# Patient Record
Sex: Female | Born: 1960 | Race: Black or African American | Hispanic: No | Marital: Single | Smoking: Never smoker
Health system: Southern US, Community
[De-identification: ages and names within clinical notes are randomized; demographics above are authoritative.]

## PROBLEM LIST (undated history)

## (undated) DIAGNOSIS — I10 Essential (primary) hypertension: Secondary | ICD-10-CM

## (undated) DIAGNOSIS — E7800 Pure hypercholesterolemia, unspecified: Secondary | ICD-10-CM

## (undated) HISTORY — PX: TUBAL LIGATION: SHX77

## (undated) HISTORY — PX: OTHER SURGICAL HISTORY: SHX169

## (undated) HISTORY — DX: Essential (primary) hypertension: I10

---

## 2005-04-15 ENCOUNTER — Ambulatory Visit: Payer: Self-pay | Admitting: Family Medicine

## 2005-12-04 ENCOUNTER — Ambulatory Visit: Payer: Self-pay | Admitting: Family Medicine

## 2007-01-06 ENCOUNTER — Ambulatory Visit: Payer: Self-pay | Admitting: Family Medicine

## 2007-07-21 ENCOUNTER — Ambulatory Visit: Payer: Self-pay | Admitting: Family Medicine

## 2016-05-27 LAB — HM HIV SCREENING LAB: HM HIV SCREENING: NEGATIVE

## 2016-05-27 LAB — HM PAP SMEAR: HM Pap smear: NEGATIVE

## 2016-05-27 LAB — HIV ANTIBODY (ROUTINE TESTING W REFLEX): HIV: NEGATIVE

## 2016-05-27 LAB — HM HEPATITIS C SCREENING LAB: HM Hepatitis Screen: NEGATIVE

## 2016-06-13 LAB — FECAL OCCULT BLOOD, GUAIAC: Fecal Occult Blood: NEGATIVE

## 2017-04-17 ENCOUNTER — Ambulatory Visit (HOSPITAL_COMMUNITY)
Admission: EM | Admit: 2017-04-17 | Discharge: 2017-04-17 | Disposition: A | Discharge status: Home / Self Care | Payer: Self-pay | Attending: Family Medicine | Admitting: Family Medicine

## 2017-04-17 ENCOUNTER — Encounter (HOSPITAL_COMMUNITY): Payer: Self-pay

## 2017-04-17 DIAGNOSIS — I10 Essential (primary) hypertension: Secondary | ICD-10-CM

## 2017-04-17 MED ORDER — AMLODIPINE BESYLATE 5 MG PO TABS: 5.0000 mg | ORAL_TABLET | Freq: Every day | ORAL | 0 refills | Status: DC

## 2017-04-17 NOTE — ED Provider Notes (Signed)
  [facility]   [id number] 04/17/17 Arrival Time: 1206  ASSESSMENT & PLAN:  1. Essential hypertension     Meds ordered this encounter  Medications  . amLODipine (NORVASC) 5 MG tablet    Sig: Take 1 tablet (5 mg total) by mouth daily.    Dispense:  30 tablet    Refill:  0    Order Specific Question:   Supervising Provider    Answer:   Eustace MooreMURRAY, LAURA W [[id number]]   [facility] and make an appointment in next few weeks.  Reviewed expectations re: course of current medical issues. Questions answered. Outlined signs and symptoms indicating need for more acute intervention. Patient verbalized understanding. After Visit Summary given.   SUBJECTIVE:  Jodi Martin is a 56 y.o. female who presents with complaint of   ROS: As per HPI.   OBJECTIVE:  Vitals:   04/17/17 1228  BP: (!) 206/122  Pulse: 68  Resp: 18  Temp: 98.7 F (37.1 C)  TempSrc: Oral  SpO2: 100%     General appearance: alert; no distress HEENT: normocephalic; atraumatic; conjunctivae normal; TMs normal; nasal mucosa normal; oral mucosa normal Neck: supple Lungs: clear to auscultation bilaterally Heart: regular rate and rhythm Abdomen: soft, non-tender; bowel sounds normal; no masses or organomegaly; no guarding or rebound tenderness Back: no CVA tenderness Extremities: no cyanosis or edema; symmetrical with no gross deformities Skin: warm and dry Neurologic: normal symmetric reflexes; normal gait Psychological:  alert and cooperative; normal mood and affect  No results found for this or any previous visit.  Labs Reviewed - No data to display  No results found.  No Known Allergies  PMHx, SurgHx, SocialHx, Medications, and Allergies were reviewed in the Visit Navigator and updated as appropriate.      Deatra CanterOxford, William J, FNP 04/17/17 1243

## 2017-04-17 NOTE — ED Triage Notes (Signed)
Pt was at the dentist today and they sent her over for high BP. Said she had a physical in June and it was normal but doesn't have a hx of high BP and isn't on medication for it. No complaints of lightheaded and dizziness but is having hot spells.

## 2017-05-01 ENCOUNTER — Encounter: Payer: Self-pay | Admitting: Physician Assistant

## 2017-05-01 ENCOUNTER — Ambulatory Visit (INDEPENDENT_AMBULATORY_CARE_PROVIDER_SITE_OTHER): Payer: Self-pay | Admitting: Physician Assistant

## 2017-05-01 VITALS — BP 170/100 | HR 73 | Temp 98.2°F | Resp 16 | Ht 63.0 in | Wt 114.6 lb

## 2017-05-01 DIAGNOSIS — I10 Essential (primary) hypertension: Secondary | ICD-10-CM

## 2017-05-01 DIAGNOSIS — E7800 Pure hypercholesterolemia, unspecified: Secondary | ICD-10-CM | POA: Insufficient documentation

## 2017-05-01 DIAGNOSIS — Z7689 Persons encountering health services in other specified circumstances: Secondary | ICD-10-CM

## 2017-05-01 MED ORDER — AMLODIPINE BESYLATE 10 MG PO TABS: 10.0000 mg | ORAL_TABLET | Freq: Every day | ORAL | 2 refills | Status: DC

## 2017-05-01 NOTE — Patient Instructions (Signed)

## 2017-05-01 NOTE — Progress Notes (Signed)
Patient: Jodi Martin Female    DOB: 1961-06-09   56 y.o.   MRN: [id number] Visit Date: 05/01/2017  Today's Provider: Trey Sailors, PA-C   Chief Complaint  Patient presents with  . New Patient (Initial Visit)   Subjective:    HPI Jodi Martin is a 56 year old female who presents today to Establish Care as a new patient. She was previously being seen at [facility]. She does not have any concerns today. Only medical history is HTN. She is currently taking Amlodipine 5 mg. Medication was prescribed at the ER on 04/17/17. ER referred patient. She will need refills today. Denies problems sleeping, says she does snore.  She was previously seen at [facility]. Had normal PAP 05/2016 and negative hemoccult card X 3. Never had colonoscopy. She has not had mammogram recently. Last mammogram in chart 2008 normal. Had negative HIV and Hep C screening 05/2016. CMET, CBC w/ diff were normal. Her lipid panel from 05/2016 showed elevated cholesterol.   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 She has is not in relationship. Has one child. Not sexually active. No smoking, alcohol, or drugs. Last menstrual cycle two years ago.    Previous Medications   No medications on file    Review of Systems  Constitutional: Negative.   HENT: Negative.   Eyes: Negative.   Respiratory: Negative.   Cardiovascular: Negative.   Gastrointestinal: Positive for abdominal distention.  Endocrine: Positive for heat intolerance.  Genitourinary: Negative.   Musculoskeletal: Negative.   Skin: Negative.   Allergic/Immunologic: Negative.   Neurological: Negative.   Hematological: Negative.   Psychiatric/Behavioral: Negative.     Social History  Substance Use Topics  . Smoking status: Never Smoker  . Smokeless tobacco: Never Used  . Alcohol use No   Objective:   BP (!) 170/100 (BP Location: Left Arm, Cuff Size: Normal)   Pulse 73   Temp 98.2 F (36.8 C) (Oral)    Resp 16   Ht 5\' 3"  (1.6 m)   Wt 114 lb 9.6 oz (52 kg)   SpO2 99%   BMI 20.30 kg/m   Physical Exam  Constitutional: She is oriented to person, place, and time. She appears well-developed and well-nourished.  HENT:  Right Ear: External ear normal.  Left Ear: External ear normal.  Mouth/Throat: Oropharynx is clear and moist. No oropharyngeal exudate.  Neck: Neck supple. Carotid bruit is not present. No thyromegaly present.  Cardiovascular: Normal rate, regular rhythm and normal heart sounds.   Pulmonary/Chest: Effort normal and breath sounds normal.  Lymphadenopathy:    She has no cervical adenopathy.  Neurological: She is alert and oriented to person, place, and time.  Skin: Skin is warm and dry.  Psychiatric: She has a normal mood and affect. Her behavior is normal.        Assessment & Plan:     1. Encounter to establish care  Self pay, declines labs today.  2. Hypertension, unspecified type  Increase to 10 mg. Will see what effect this has, possibly explore sleep apnea. 10 year CVD risk 15.8% right now, but may change when BP better controlled. Will discuss with patient at follow up repeating lipids. Counseled on signs/symptoms indicating need for emergent care.  - amLODipine (NORVASC) 10 MG tablet; Take 1 tablet (10 mg total) by mouth daily.  Dispense: 30 tablet; Refill: 2    Follow up: Return in about 1 month (around 06/01/2017) for BP, med check .

## 2017-06-05 ENCOUNTER — Encounter: Payer: Self-pay | Admitting: Physician Assistant

## 2017-06-05 ENCOUNTER — Ambulatory Visit (INDEPENDENT_AMBULATORY_CARE_PROVIDER_SITE_OTHER): Payer: Self-pay | Admitting: Physician Assistant

## 2017-06-05 ENCOUNTER — Telehealth: Payer: Self-pay | Admitting: Physician Assistant

## 2017-06-05 DIAGNOSIS — I10 Essential (primary) hypertension: Secondary | ICD-10-CM | POA: Insufficient documentation

## 2017-06-05 MED ORDER — LISINOPRIL 10 MG PO TABS: 10.0000 mg | ORAL_TABLET | Freq: Every day | ORAL | 3 refills | Status: DC

## 2017-06-05 NOTE — Progress Notes (Signed)
Patient: Jodi Martin Female    DOB: [birth year]   56 y.o.   MRN: [id number] Visit Date: 06/05/2017  Today's Provider: Trey Sailors, PA-C   Chief Complaint  Patient presents with  . Hypertension    1 month follow up   Subjective:    Pt comes in today for a one month follow up on blood pressure.  At her last office visit her Amlodipine was increased to  a day.  She reports she is tolerating the increase well.  She has not been checking her blood pressure but reports feeling well.      Hypertension  This is a chronic problem. The problem has been gradually improving since onset. Associated symptoms include peripheral edema. Pertinent negatives include no anxiety, blurred vision, chest pain, headaches, malaise/fatigue, neck pain, palpitations, PND or shortness of breath. There are no associated agents to hypertension. Past treatments include calcium channel blockers. There are no compliance problems.        No Known Allergies   Current Outpatient Prescriptions:  .  amLODipine (NORVASC) 10 MG tablet, Take 1 tablet (10 mg total) by mouth daily., Disp: 30 tablet, Rfl: 2  Review of Systems  Constitutional: Negative.  Negative for malaise/fatigue.  Eyes: Negative for blurred vision.  Respiratory: Negative.  Negative for shortness of breath.   Cardiovascular: Positive for leg swelling (Only mild swelling). Negative for chest pain, palpitations and PND.  Gastrointestinal: Negative.   Musculoskeletal: Negative.  Negative for neck pain.  Neurological: Negative for dizziness, light-headedness and headaches.    Social History  Substance Use Topics  . Smoking status: Never Smoker  . Smokeless tobacco: Never Used  . Alcohol use No   Objective:   BP (!) 156/86 (BP Location: Left Arm, Patient Position: Sitting, Cuff Size: Normal)   Pulse 64   Temp 98 F (36.7 C) (Oral)   Resp 16   Wt 113 lb (51.3 kg)   BMI 20.02 kg/m  Vitals:   06/05/17 0904  BP: (!)  156/86  Pulse: 64  Resp: 16  Temp: 98 F (36.7 C)  TempSrc: Oral  Weight: 113 lb (51.3 kg)     Physical Exam  Constitutional: She is oriented to person, place, and time. She appears well-developed and well-nourished.  Cardiovascular: Normal rate and regular rhythm.   Pulmonary/Chest: Effort normal and breath sounds normal.  Neurological: She is alert and oriented to person, place, and time.  Skin: Skin is warm and dry.  Psychiatric: She has a normal mood and affect. Her behavior is normal.        Assessment & Plan:     1. Hypertension, unspecified type  Decreased from last visit, but still not controlled. I have reviewed her CMET from 05/2016 with [facility] and her renal function was normal and stable. Will start 10 mg of Lisinopril in addition to amlodipine. Counseled on cough, angioedema. Will see in three months for BP check and follow up CMET.  - lisinopril (PRINIVIL,ZESTRIL) 10 MG tablet; Take 1 tablet (10 mg total) by mouth daily.  Dispense: 90 tablet; Refill: 3  Return in about 3 months (around 09/04/2017) for HTN, labs.  The entirety of the information documented in the History of Present Illness, Review of Systems and Physical Exam were personally obtained by me. Portions of this information were initially documented by Kavin Leech, CMA and reviewed by me for thoroughness and accuracy.         Trey Sailors, PA-C  [facility]

## 2017-06-05 NOTE — Telephone Encounter (Signed)
Pt was in today and seen Adriana and she is wanting to know if she is supposed to be taking the Amlodipine 10 mg.  She did not understand if she was or not.  Her call back is [phone]  Thanks teri

## 2017-06-05 NOTE — Telephone Encounter (Signed)
Yes, in addition. Thanks.

## 2017-06-05 NOTE — Patient Instructions (Signed)
Coricidin - decongestant for high blood pressure Flonase - is good for congestion NO pseudoephedrine or phenylephrine - commonly known as Sudafed   Hypertension Hypertension is another name for high blood pressure. High blood pressure forces your heart to work harder to pump blood. This can cause problems over time. There are two numbers in a blood pressure reading. There is a top number (systolic) over a bottom number (diastolic). It is best to have a blood pressure below 120/80. Healthy choices can help lower your blood pressure. You may need medicine to help lower your blood pressure if:  Your blood pressure cannot be lowered with healthy choices.  Your blood pressure is higher than 130/80.  Follow these instructions at home: Eating and drinking  If directed, follow the DASH eating plan. This diet includes: ? Filling half of your plate at each meal with fruits and vegetables. ? Filling one quarter of your plate at each meal with whole grains. Whole grains include whole wheat pasta, brown rice, and whole grain bread. ? Eating or drinking low-fat dairy products, such as skim milk or low-fat yogurt. ? Filling one quarter of your plate at each meal with low-fat (lean) proteins. Low-fat proteins include fish, skinless chicken, eggs, beans, and tofu. ? Avoiding fatty meat, cured and processed meat, or chicken with skin. ? Avoiding premade or processed food.  Eat less than 1,500 mg of salt (sodium) a day.  Limit alcohol use to no more than 1 drink a day for nonpregnant women and 2 drinks a day for men. One drink equals 12 oz of beer, 5 oz of wine, or 1 oz of hard liquor. Lifestyle  Work with your doctor to stay at a healthy weight or to lose weight. Ask your doctor what the best weight is for you.  Get at least 30 minutes of exercise that causes your heart to beat faster (aerobic exercise) most days of the week. This may include walking, swimming, or biking.  Get at least 30 minutes of  exercise that strengthens your muscles (resistance exercise) at least 3 days a week. This may include lifting weights or pilates.  Do not use any products that contain nicotine or tobacco. This includes cigarettes and e-cigarettes. If you need help quitting, ask your doctor.  Check your blood pressure at home as told by your doctor.  Keep all follow-up visits as told by your doctor. This is important. Medicines  Take over-the-counter and prescription medicines only as told by your doctor. Follow directions carefully.  Do not skip doses of blood pressure medicine. The medicine does not work as well if you skip doses. Skipping doses also puts you at risk for problems.  Ask your doctor about side effects or reactions to medicines that you should watch for. Contact a doctor if:  You think you are having a reaction to the medicine you are taking.  You have headaches that keep coming back (recurring).  You feel dizzy.  You have swelling in your ankles.  You have trouble with your vision. Get help right away if:  You get a very bad headache.  You start to feel confused.  You feel weak or numb.  You feel faint.  You get very bad pain in your: ? Chest. ? Belly (abdomen).  You throw up (vomit) more than once.  You have trouble breathing. Summary  Hypertension is another name for high blood pressure.  Making healthy choices can help lower blood pressure. If your blood pressure cannot be controlled  with healthy choices, you may need to take medicine. This information is not intended to replace advice given to you by your health care provider. Make sure you discuss any questions you have with your health care provider. Document Released: 02/11/2008 Document Revised: 07/23/2016 Document Reviewed: 07/23/2016 Elsevier Interactive Patient Education  [employer].

## 2017-06-09 NOTE — Telephone Encounter (Signed)
Pt advised Friday.   Thanks,   -Vernona Rieger

## 2017-08-01 ENCOUNTER — Other Ambulatory Visit: Payer: Self-pay | Admitting: Physician Assistant

## 2017-08-01 DIAGNOSIS — I10 Essential (primary) hypertension: Secondary | ICD-10-CM

## 2017-09-04 ENCOUNTER — Ambulatory Visit: Payer: Self-pay | Admitting: Physician Assistant

## 2017-09-04 VITALS — BP 148/80 | HR 82 | Temp 97.7°F | Resp 16 | Wt 113.0 lb

## 2017-09-04 DIAGNOSIS — E785 Hyperlipidemia, unspecified: Secondary | ICD-10-CM

## 2017-09-04 DIAGNOSIS — I10 Essential (primary) hypertension: Secondary | ICD-10-CM

## 2017-09-04 MED ORDER — AMLODIPINE BESYLATE 10 MG PO TABS: 10.0000 mg | ORAL_TABLET | Freq: Every day | ORAL | 1 refills | Status: DC

## 2017-09-04 NOTE — Patient Instructions (Signed)

## 2017-09-04 NOTE — Progress Notes (Signed)
Patient: Jodi Martin Female    DOB: May 28, 1961   56 y.o.   MRN: [id number] Visit Date: 09/04/2017  Today's Provider: Trey SailorsAdriana M Pollak, PA-C   Chief Complaint  Patient presents with  . Hypertension   Subjective:    HPI   She reports she is doing well with the addition of Lisinopril. No cough, angioedema. She reports she is out of Lisinopril and did not take that medication today.    Hypertension, follow-up:  BP Readings from Last 3 Encounters:  09/04/17 (!) 148/80  06/05/17 (!) 156/86  05/01/17 (!) 170/100    She was last seen for hypertension 3 months ago.  BP at that visit was 156/86. Management since that visit includes increased adding 10 mg Lisinopril QD to her 10 mg amlodipine QD. She reports good compliance with treatment. She is not having side effects.  She is exercising. She is adherent to low salt diet.   Outside blood pressures are not being checked recently. She is experiencing none.  Patient denies chest pain, chest pressure/discomfort, claudication, dyspnea, exertional chest pressure/discomfort, fatigue, irregular heart beat, lower extremity edema, near-syncope, orthopnea, palpitations, paroxysmal nocturnal dyspnea, syncope and tachypnea.   Cardiovascular risk factors include hypertension.  Use of agents associated with hypertension: none.     Weight trend: stable Wt Readings from Last 3 Encounters:  09/04/17 113 lb (51.3 kg)  06/05/17 113 lb (51.3 kg)  05/01/17 114 lb 9.6 oz (52 kg)      ------------------------------------------------------------------------ .     No Known Allergies   Current Outpatient Medications:  .  amLODipine (NORVASC) 10 MG tablet, TAKE 1 TABLET BY MOUTH ONCE DAILY, Disp: 30 tablet, Rfl: 2 .  lisinopril (PRINIVIL,ZESTRIL) 10 MG tablet, Take 1 tablet (10 mg total) by mouth daily., Disp: 90 tablet, Rfl: 3  Review of Systems  Constitutional: Negative.   HENT: Negative.   Eyes: Negative.     Respiratory: Negative.   Cardiovascular: Negative.   Gastrointestinal: Negative.   Endocrine: Negative.   Genitourinary: Negative.   Musculoskeletal: Negative.   Skin: Negative.   Allergic/Immunologic: Negative.   Neurological: Negative.   Hematological: Negative.   Psychiatric/Behavioral: Negative.     Social History   Tobacco Use  . Smoking status: Never Smoker  . Smokeless tobacco: Never Used  Substance Use Topics  . Alcohol use: No   Objective:   BP (!) 148/80 (BP Location: Left Arm, Patient Position: Sitting, Cuff Size: Normal)   Pulse 82   Temp 97.7 F (36.5 C) (Oral)   Resp 16   Wt 113 lb (51.3 kg)   BMI 20.02 kg/m  Vitals:   09/04/17 0859  BP: (!) 148/80  Pulse: 82  Resp: 16  Temp: 97.7 F (36.5 C)  TempSrc: Oral  Weight: 113 lb (51.3 kg)     Physical Exam  Constitutional: She is oriented to person, place, and time. She appears well-developed and well-nourished.  Cardiovascular: Normal rate.  Pulmonary/Chest: Effort normal and breath sounds normal.  Neurological: She is alert and oriented to person, place, and time.  Skin: Skin is warm and dry.  Psychiatric: She has a normal mood and affect. Her behavior is normal.        Assessment & Plan:     1. Hypertension, unspecified type  Need follow up labs today after addition of Lisinopril. Her BP is slightly elevated but she did not take her Lisinopril today and so we have mutually agreed to recheck at follow up  visit before increasing medication. Will see her in 3 months for physical.  - amLODipine (NORVASC) 10 MG tablet; Take 1 tablet (10 mg total) by mouth daily.  Dispense: 90 tablet; Refill: 1 - Comprehensive Metabolic Panel (CMET) - Lipid Profile  2. Hyperlipidemia, unspecified hyperlipidemia type  Prior cholesterol from [facility] in Care Everywhere, total cholesterol is 287 and LDL was 203. On basis of LDL alone, she qualifies for statin, in addition to other risks such as her BP. Will recheck and  have counseled that we will likely need to start statin.  - Comprehensive Metabolic Panel (CMET) - Lipid Profile  Return in about 3 months (around 12/03/2017) for CPE.  The entirety of the information documented in the History of Present Illness, Review of Systems and Physical Exam were personally obtained by me. Portions of this information were initially documented by Kavin LeechLaura Walsh, CMA and reviewed by me for thoroughness and accuracy.        Trey SailorsAdriana M Pollak, PA-C  [facility]

## 2017-10-31 LAB — COMPREHENSIVE METABOLIC PANEL
ALT: 9 IU/L (ref 0–32)
AST: 19 IU/L (ref 0–40)
Albumin/Globulin Ratio: 1.4 (ref 1.2–2.2)
Albumin: 4.6 g/dL (ref 3.5–5.5)
Alkaline Phosphatase: 83 IU/L (ref 39–117)
BUN/Creatinine Ratio: 16 (ref 9–23)
BUN: 15 mg/dL (ref 6–24)
Bilirubin Total: 0.6 mg/dL (ref 0.0–1.2)
CO2: 21 mmol/L (ref 20–29)
Calcium: 9.5 mg/dL (ref 8.7–10.2)
Chloride: 104 mmol/L (ref 96–106)
Creatinine, Ser: 0.94 mg/dL (ref 0.57–1.00)
GFR calc Af Amer: 78 mL/min/{1.73_m2} (ref >59)
GFR calc non Af Amer: 68 mL/min/{1.73_m2} (ref >59)
Globulin, Total: 3.2 g/dL (ref 1.5–4.5)
Glucose: 105 mg/dL — ABNORMAL HIGH (ref 65–99)
Potassium: 4 mmol/L (ref 3.5–5.2)
Sodium: 141 mmol/L (ref 134–144)
Total Protein: 7.8 g/dL (ref 6.0–8.5)

## 2017-10-31 LAB — LIPID PANEL
Chol/HDL Ratio: 3.2 ratio (ref 0.0–4.4)
Cholesterol, Total: 243 mg/dL — ABNORMAL HIGH (ref 100–199)
HDL: 76 mg/dL (ref >39)
LDL Calculated: 158 mg/dL — ABNORMAL HIGH (ref 0–99)
Triglycerides: 43 mg/dL (ref 0–149)
VLDL Cholesterol Cal: 9 mg/dL (ref 5–40)

## 2017-11-03 ENCOUNTER — Telehealth: Payer: Self-pay

## 2017-11-03 NOTE — Telephone Encounter (Signed)
-----   Message from Trey SailorsAdriana M Pollak, [place]PA-C sent at 11/03/2017 10:31 AM EST ----- CMET normal. Cholesterol decreased from last time, but still elevated. However, if her blood pressure comes under control, she should not need medication for her cholesterol. We can check her blood pressure at her follow up visit and discuss this further.

## 2017-11-03 NOTE — Telephone Encounter (Signed)
Pt advised.   Thanks,   -Tag Wurtz  

## 2017-12-04 ENCOUNTER — Ambulatory Visit (INDEPENDENT_AMBULATORY_CARE_PROVIDER_SITE_OTHER): Payer: Self-pay | Admitting: Physician Assistant

## 2017-12-04 ENCOUNTER — Encounter: Payer: Self-pay | Admitting: Physician Assistant

## 2017-12-04 VITALS — BP 138/84 | HR 64 | Temp 97.9°F | Resp 16 | Ht 63.0 in | Wt 112.0 lb

## 2017-12-04 DIAGNOSIS — Z1239 Encounter for other screening for malignant neoplasm of breast: Secondary | ICD-10-CM

## 2017-12-04 DIAGNOSIS — Z1231 Encounter for screening mammogram for malignant neoplasm of breast: Secondary | ICD-10-CM

## 2017-12-04 DIAGNOSIS — Z1211 Encounter for screening for malignant neoplasm of colon: Secondary | ICD-10-CM

## 2017-12-04 DIAGNOSIS — I10 Essential (primary) hypertension: Secondary | ICD-10-CM

## 2017-12-04 MED ORDER — LISINOPRIL 20 MG PO TABS: 20.0000 mg | ORAL_TABLET | Freq: Every day | ORAL | 1 refills | Status: DC

## 2017-12-04 NOTE — Patient Instructions (Signed)

## 2017-12-04 NOTE — Progress Notes (Signed)
Patient: Jodi Martin, Female    DOB: Nov 24, 1960, 57 y.o.   MRN: [id number] Visit Date: 12/07/2017  Today's Provider: Trey Sailors, PA-C   Chief Complaint  Patient presents with  . Annual Exam   Subjective:    Annual physical exam Jodi Martin is a 57 y.o. female who presents today for health maintenance and complete physical. She feels well. She reports exercising some. She reports she is sleeping well.  She has not had a mammogram in years, but her last one was normal.   Her PAP/HPV were normal and negative in 2017.  She is due for colon cancer screening, last hemoccult negative x 3 in 2017.  Her BP remains elevated today on 10 mg amlodipine and 10 mg Lisinopril.  -----------------------------------------------------------------   Review of Systems  Social History      She  reports that she has never smoked. She has never used smokeless tobacco. She reports that she does not drink alcohol or use drugs.       Social History   Socioeconomic History  . Marital status: Single    Spouse name: Not on file  . Number of children: Not on file  . Years of education: Not on file  . Highest education level: Not on file  Occupational History  . Not on file  Social Needs  . Financial resource strain: Not on file  . Food insecurity:    Worry: Not on file    Inability: Not on file  . Transportation needs:    Medical: Not on file    Non-medical: Not on file  Tobacco Use  . Smoking status: Never Smoker  . Smokeless tobacco: Never Used  Substance and Sexual Activity  . Alcohol use: No  . Drug use: No  . Sexual activity: Not on file  Lifestyle  . Physical activity:    Days per week: Not on file    Minutes per session: Not on file  . Stress: Not on file  Relationships  . Social connections:    Talks on phone: Not on file    Gets together: Not on file    Attends religious service: Not on file    Active member of club or organization: Not  on file    Attends meetings of clubs or organizations: Not on file    Relationship status: Not on file  Other Topics Concern  . Not on file  Social History Narrative  . Not on file    Past Medical History:  Diagnosis Date  . Hypertension      Patient Active Problem List   Diagnosis Date Noted  . Hypertension 06/05/2017  . Hypercholesteremia 05/01/2017    Past Surgical History:  Procedure Laterality Date  . OTHER SURGICAL HISTORY     fallopian tube removal  . TUBAL LIGATION      Family History        Family Status  Relation Name Status  . Mother  Alive  . Father  Deceased  . Emelda Brothers  (Not Specified)  . MGM  Deceased  . Sister  Alive  . Brother  Deceased  . Daughter  Alive  . MGF  Deceased  . PGM  Deceased  . PGF  Deceased  . Sister  Alive        Her family history includes Cancer in her brother; Heart attack in her paternal aunt; Hypertension in her father; Stroke in her maternal grandmother and mother.  No Known Allergies   Current Outpatient Medications:  .  amLODipine (NORVASC) 10 MG tablet, Take 1 tablet (10 mg total) by mouth daily., Disp: 90 tablet, Rfl: 1 .  lisinopril (PRINIVIL,ZESTRIL) 20 MG tablet, Take 1 tablet (20 mg total) by mouth daily., Disp: 90 tablet, Rfl: 1   Patient Care Team: Maryella ShiversPollak, Adriana M, PA-C as PCP - General (Physician Assistant)      Objective:   Vitals: BP 138/84 (BP Location: Right Arm, Patient Position: Sitting, Cuff Size: Normal)   Pulse 64   Temp 97.9 F (36.6 C) (Oral)   Resp 16   Ht 5\' 3"  (1.6 m)   Wt 112 lb (50.8 kg)   BMI 19.84 kg/m    Vitals:   12/04/17 0854  BP: 138/84  Pulse: 64  Resp: 16  Temp: 97.9 F (36.6 C)  TempSrc: Oral  Weight: 112 lb (50.8 kg)  Height: 5\' 3"  (1.6 m)     Physical Exam  Constitutional: She is oriented to person, place, and time. She appears well-developed and well-nourished.  Cardiovascular: Normal rate and regular rhythm.  Pulmonary/Chest: Effort normal and  breath sounds normal. Right breast exhibits no inverted nipple, no mass, no nipple discharge, no skin change and no tenderness. Left breast exhibits no inverted nipple, no mass, no nipple discharge, no skin change and no tenderness. Breasts are symmetrical.  Neurological: She is alert and oriented to person, place, and time.  Skin: Skin is warm and dry.  Psychiatric: She has a normal mood and affect. Her behavior is normal.     Depression Screen PHQ 2/9 Scores 12/04/2017 05/01/2017  PHQ - 2 Score 0 0  PHQ- 9 Score 0 -      Assessment & Plan:     Routine Health Maintenance and Physical Exam  Exercise Activities and Dietary recommendations Goals    None      Immunization History  Administered Date(s) Administered  . Tdap 05/27/2016    Health Maintenance  Topic Date Due  . MAMMOGRAM  02/19/2010  . COLONOSCOPY  02/19/2010  . INFLUENZA VACCINE  04/08/2018  . PAP SMEAR  05/28/2019  . TETANUS/TDAP  05/27/2026  . Hepatitis C Screening  Completed  . HIV Screening  Completed     Discussed health benefits of physical activity, and encouraged her to engage in regular exercise appropriate for her age and condition.    1. Hypertension, unspecified type  Will increase Lisinopril to 20 mg daily along with her 10 mg amlodipine daily.   - lisinopril (PRINIVIL,ZESTRIL) 20 MG tablet; Take 1 tablet (20 mg total) by mouth daily.  Dispense: 90 tablet; Refill: 1  2. Breast cancer screening  Have referred to [facility] for low or no cost mammogram due to self pay status.  - Ambulatory referral to [facility] MM Digital Screening; Future  3. Colon cancer screening  Have sent her home with OC light to be returned to clinic when she has completed this, 2/2 self pay status as colonoscopy and cologuard are cost prohibitive. Will update results when she returns this test.  Return in about 3 months (around 03/06/2018) for HTN.  The entirety of the information documented in the History of  Present Illness, Review of Systems and Physical Exam were personally obtained by me. Portions of this information were initially documented by Kavin LeechLaura Walsh, CMA and reviewed by me for thoroughness and accuracy.   --------------------------------------------------------------------    Trey SailorsAdriana M Pollak, PA-C  [facility]

## 2018-03-10 ENCOUNTER — Ambulatory Visit (INDEPENDENT_AMBULATORY_CARE_PROVIDER_SITE_OTHER): Payer: Self-pay | Admitting: Physician Assistant

## 2018-03-10 ENCOUNTER — Ambulatory Visit: Payer: Self-pay | Admitting: Physician Assistant

## 2018-03-10 ENCOUNTER — Encounter: Payer: Self-pay | Admitting: Physician Assistant

## 2018-03-10 VITALS — BP 122/82 | HR 64 | Temp 98.0°F | Wt 111.0 lb

## 2018-03-10 DIAGNOSIS — Z1211 Encounter for screening for malignant neoplasm of colon: Secondary | ICD-10-CM

## 2018-03-10 DIAGNOSIS — E7800 Pure hypercholesterolemia, unspecified: Secondary | ICD-10-CM

## 2018-03-10 DIAGNOSIS — I10 Essential (primary) hypertension: Secondary | ICD-10-CM

## 2018-03-10 MED ORDER — ATORVASTATIN CALCIUM 10 MG PO TABS: 10.0000 mg | ORAL_TABLET | Freq: Every day | ORAL | 0 refills | Status: DC

## 2018-03-10 NOTE — Progress Notes (Signed)
Patient: Jodi Martin Female    DOB: 1961/08/18   57 y.o.   MRN: [id number] Visit Date: 03/12/2018  Today's Provider: Trey Sailors, PA-C   Chief Complaint  Patient presents with  . Hypertension    follow up    Subjective:    HPI  Hypertension, follow-up:  BP Readings from Last 3 Encounters:  03/10/18 122/82  12/04/17 138/84  09/04/17 (!) 148/80    She was last seen for hypertension 3 months ago.  BP at that visit was 138/84. Management changes since that visit include increase Lisinopril to 20 mg and continue Amlodipine. She reports good compliance with treatment. She is not having side effects.  She is not exercising. She is adherent to low salt diet.   Outside blood pressures are not being checked. She is experiencing none.  Patient denies chest pain, chest pressure/discomfort, claudication, dyspnea, exertional chest pressure/discomfort, fatigue, irregular heart beat, lower extremity edema, near-syncope, orthopnea, palpitations, paroxysmal nocturnal dyspnea, syncope and tachypnea.   Cardiovascular risk factors include dyslipidemia and hypertension.  Use of agents associated with hypertension: none.     Weight trend: stable Wt Readings from Last 3 Encounters:  03/10/18 111 lb (50.3 kg)  12/04/17 112 lb (50.8 kg)  09/04/17 113 lb (51.3 kg)   HLD: CVD risk >10%, indicates statin.   Colon Cancer: Her daughter brought by hemoccult card 2 months ago but unfortunately I do not see that it was ordered in the computer.  ------------------------------------------------------------------------     No Known Allergies   Current Outpatient Medications:  .  Ascorbic Acid (VITAMIN C PO), Take by mouth., Disp: , Rfl:  .  lisinopril (PRINIVIL,ZESTRIL) 20 MG tablet, Take 1 tablet (20 mg total) by mouth daily., Disp: 90 tablet, Rfl: 1 .  amLODipine (NORVASC) 10 MG tablet, Take 1 tablet (10 mg total) by mouth daily., Disp: 90 tablet, Rfl: 1 .  atorvastatin  (LIPITOR) 10 MG tablet, Take 1 tablet (10 mg total) by mouth daily., Disp: 90 tablet, Rfl: 0  Review of Systems  Constitutional: Negative.   Respiratory: Negative.   Cardiovascular: Negative.     Social History   Tobacco Use  . Smoking status: Never Smoker  . Smokeless tobacco: Never Used  Substance Use Topics  . Alcohol use: No   Objective:   BP 122/82 (BP Location: Left Arm, Patient Position: Sitting, Cuff Size: Normal)   Pulse 64   Temp 98 F (36.7 C) (Oral)   Wt 111 lb (50.3 kg)   SpO2 97%   BMI 19.66 kg/m  Vitals:   03/10/18 0857  BP: 122/82  Pulse: 64  Temp: 98 F (36.7 C)  TempSrc: Oral  SpO2: 97%  Weight: 111 lb (50.3 kg)     Physical Exam  Constitutional: She is oriented to person, place, and time. She appears well-developed and well-nourished.  Cardiovascular: Normal rate and regular rhythm.  Pulmonary/Chest: Effort normal and breath sounds normal.  Neurological: She is alert and oriented to person, place, and time.  Skin: Skin is warm and dry.  Psychiatric: She has a normal mood and affect. Her behavior is normal.        Assessment & Plan:     1. Hypertension, unspecified type  Well controlled today. Continue current medications.   2. Hypercholesteremia  - atorvastatin (LIPITOR) 10 MG tablet; Take 1 tablet (10 mg total) by mouth daily.  Dispense: 90 tablet; Refill: 0  3. Colon cancer screening  Offered my apologies about her previous hemoccult  card, I am not sure what happened to the order for this. Will have patient drop off a new sample and have order placed when she brings it in.   Return in about 3 months (around 06/10/2018) for HTN, HLD, labs.  The entirety of the information documented in the History of Present Illness, Review of Systems and Physical Exam were personally obtained by me. Portions of this information were initially documented by Kavin LeechLaura Walsh, CMA and reviewed by me for thoroughness and accuracy.          Trey SailorsAdriana M  Krislynn Gronau, PA-C  [facility]

## 2018-03-10 NOTE — Patient Instructions (Signed)

## 2018-04-29 ENCOUNTER — Other Ambulatory Visit: Payer: Self-pay | Admitting: Physician Assistant

## 2018-04-29 DIAGNOSIS — I10 Essential (primary) hypertension: Secondary | ICD-10-CM

## 2018-06-11 ENCOUNTER — Other Ambulatory Visit: Payer: Self-pay | Admitting: Physician Assistant

## 2018-06-11 DIAGNOSIS — E7800 Pure hypercholesterolemia, unspecified: Secondary | ICD-10-CM

## 2018-06-18 ENCOUNTER — Ambulatory Visit (INDEPENDENT_AMBULATORY_CARE_PROVIDER_SITE_OTHER): Payer: Self-pay | Admitting: Physician Assistant

## 2018-06-18 ENCOUNTER — Encounter: Payer: Self-pay | Admitting: Physician Assistant

## 2018-06-18 VITALS — BP 120/80 | HR 72 | Temp 98.1°F | Resp 16 | Wt 114.0 lb

## 2018-06-18 DIAGNOSIS — I10 Essential (primary) hypertension: Secondary | ICD-10-CM

## 2018-06-18 DIAGNOSIS — Z1239 Encounter for other screening for malignant neoplasm of breast: Secondary | ICD-10-CM

## 2018-06-18 DIAGNOSIS — E7800 Pure hypercholesterolemia, unspecified: Secondary | ICD-10-CM

## 2018-06-18 NOTE — Patient Instructions (Signed)

## 2018-06-18 NOTE — Progress Notes (Signed)
Patient: Jodi Martin Female    DOB: [birth year]   57 y.o.   MRN: [id number] Visit Date: 06/18/2018  Today's Provider: Trey Sailors, PA-C   Chief Complaint  Patient presents with  . Hypertension  . Hyperlipidemia   Subjective:    HPI  Hypertension, follow-up:  BP Readings from Last 3 Encounters:  06/18/18 120/80  03/10/18 122/82  12/04/17 138/84    She was last seen for hypertension 3 months ago.  BP at that visit was 122/82. Management changes since that visit include no changes. She reports excellent compliance with treatment. Taking amlodipine 10 mg and Lisinopril 20 mg daily. She is not having side effects.  She is exercising. She is adherent to low salt diet.   Outside blood pressures are stable. She is experiencing none.  Patient denies chest pain.   Cardiovascular risk factors include dyslipidemia and hypertension.  Use of agents associated with hypertension: none.     Weight trend: stable Wt Readings from Last 3 Encounters:  06/18/18 114 lb (51.7 kg)  03/10/18 111 lb (50.3 kg)  12/04/17 112 lb (50.8 kg)    Current diet: in general, a "healthy" diet    ------------------------------------------------------------------------   Follow up for hyperlipidimia  The patient was last seen for this 3 months ago. Changes made at last visit include no changes.  She reports excellent compliance with treatment. She has been taking 10 mg Lipitor QD. She feels that condition is Improved. She is not having side effects.   ------------------------------------------------------------------------------------       No Known Allergies   Current Outpatient Medications:  .  amLODipine (NORVASC) 10 MG tablet, TAKE 1 TABLET BY MOUTH ONCE DAILY, Disp: 90 tablet, Rfl: 1 .  Ascorbic Acid (VITAMIN C PO), Take by mouth., Disp: , Rfl:  .  atorvastatin (LIPITOR) 10 MG tablet, TAKE 1 TABLET BY MOUTH ONCE DAILY, Disp: 90 tablet, Rfl: 0 .  lisinopril  (PRINIVIL,ZESTRIL) 20 MG tablet, Take 1 tablet (20 mg total) by mouth daily., Disp: 90 tablet, Rfl: 1  Review of Systems  Constitutional: Negative.   Respiratory: Negative.   Cardiovascular: Negative.     Social History   Tobacco Use  . Smoking status: Never Smoker  . Smokeless tobacco: Never Used  Substance Use Topics  . Alcohol use: No   Objective:   BP 120/80 (BP Location: Left Arm, Patient Position: Sitting, Cuff Size: Normal)   Pulse 72   Temp 98.1 F (36.7 C) (Oral)   Resp 16   Wt 114 lb (51.7 kg)   SpO2 98%   BMI 20.19 kg/m  Vitals:   06/18/18 0858  BP: 120/80  Pulse: 72  Resp: 16  Temp: 98.1 F (36.7 C)  TempSrc: Oral  SpO2: 98%  Weight: 114 lb (51.7 kg)     Physical Exam  Constitutional: She is oriented to person, place, and time. She appears well-developed and well-nourished.  Cardiovascular: Normal rate and regular rhythm.  Pulmonary/Chest: Effort normal and breath sounds normal.  Neurological: She is alert and oriented to person, place, and time.  Skin: Skin is warm and dry.  Psychiatric: She has a normal mood and affect. Her behavior is normal.        Assessment & Plan:     1. Hypertension, unspecified type  Well controlled today, continue medications. Sent home with stool test.   2. Hypercholesteremia  Well controlled today, continue medications.  - Lipid Profile - Comprehensive Metabolic Panel (CMET)  3.  Breast cancer screening  - Ambulatory referral to Chronic Care Management Services  Return in about 6 months (around 12/18/2018) for HTN, HLD.  The entirety of the information documented in the History of Present Illness, Review of Systems and Physical Exam were personally obtained by me. Portions of this information were initially documented by Rondel Baton, CMA and reviewed by me for thoroughness and accuracy.        Trey Sailors, PA-C  [facility]

## 2018-06-19 LAB — COMPREHENSIVE METABOLIC PANEL
ALT: 11 IU/L (ref 0–32)
AST: 18 IU/L (ref 0–40)
Albumin/Globulin Ratio: 1.7 (ref 1.2–2.2)
Albumin: 4.5 g/dL (ref 3.5–5.5)
Alkaline Phosphatase: 67 IU/L (ref 39–117)
BUN/Creatinine Ratio: 12 (ref 9–23)
BUN: 9 mg/dL (ref 6–24)
Bilirubin Total: 0.9 mg/dL (ref 0.0–1.2)
CO2: 24 mmol/L (ref 20–29)
Calcium: 9.6 mg/dL (ref 8.7–10.2)
Chloride: 104 mmol/L (ref 96–106)
Creatinine, Ser: 0.78 mg/dL (ref 0.57–1.00)
GFR calc Af Amer: 97 mL/min/{1.73_m2} (ref >59)
GFR calc non Af Amer: 84 mL/min/{1.73_m2} (ref >59)
Globulin, Total: 2.6 g/dL (ref 1.5–4.5)
Glucose: 87 mg/dL (ref 65–99)
Potassium: 3.7 mmol/L (ref 3.5–5.2)
Sodium: 142 mmol/L (ref 134–144)
Total Protein: 7.1 g/dL (ref 6.0–8.5)

## 2018-06-19 LAB — LIPID PANEL
Chol/HDL Ratio: 2.3 ratio (ref 0.0–4.4)
Cholesterol, Total: 167 mg/dL (ref 100–199)
HDL: 73 mg/dL (ref >39)
LDL Calculated: 87 mg/dL (ref 0–99)
Triglycerides: 33 mg/dL (ref 0–149)
VLDL Cholesterol Cal: 7 mg/dL (ref 5–40)

## 2018-06-21 ENCOUNTER — Telehealth: Payer: Self-pay

## 2018-06-21 ENCOUNTER — Ambulatory Visit: Payer: Self-pay | Admitting: *Deleted

## 2018-06-21 DIAGNOSIS — Z1239 Encounter for other screening for malignant neoplasm of breast: Secondary | ICD-10-CM

## 2018-06-21 NOTE — Progress Notes (Signed)
  Care Management Note  06/21/2018 Name: Jodi Martin MRN: [id number] DOB: 1960-12-28  Referral placed to the care management team by Carles Collet PA-C who requests assistance for Jodi Martin for a resource for breast cancer screening. I was able to reach [name] today and provided information as outlined below.  Goals Addressed    . "I want to have breast cancer screening" (pt-stated)       Patient does not have health coverage and wants to have breast cancer screening.   Clinical Goal(s): Over the next 14 days, patient will verbalize understanding of community breast cancer screening resource.   Interventions: Provided patient with community resource:  Women who are uninsured or underinsured may be eligible to receive a free mammogram and pap smear through the Breast and Cervical Cancer Control Program (BCCCP). For more information about eligibility for this program, call [phone].    Plan: The Care Management Team at [facility] will follow up with [name] in 2 weeks to ensure that her resource needs are met.   [name] was given information about Care Management services today including:  1. Case Management services includes personalized support from designated clinical staff supervised by her physician, including individualized plan of care and coordination with other care providers 2. 24/7 contact phone numbers for assistance for urgent and routine care needs. 3. The patient may stop case management services at any time by phone call to the office staff.  Patient agreed to services and verbal consent obtained.  [facility] [phone]

## 2018-06-21 NOTE — Patient Instructions (Addendum)
Please call the resource below to inquire about free breast cancer screening:  Women who are uninsured or underinsured may be eligible to receive a free mammogram and pap smear through the Breast and Cervical Cancer Control Program (BCCCP). For more information about eligibility for this program, call [phone]. Learn how you can help.   Marja Kays MHA,BSN,RN,CCM Nurse Care Coordinator [facility] Care [facility] [phone]  Ms. Stanfill was given information about Care Management services today including:  1. Case Management services includes personalized support from designated clinical staff supervised by her physician, including individualized plan of care and coordination with other care providers 2. 24/7 contact phone numbers for assistance for urgent and routine care needs. 3. The patient may stop case management services at any time by phone call to the office staff.  Patient agreed to services and verbal consent obtained.

## 2018-06-21 NOTE — Telephone Encounter (Signed)
-----   Message from Trey Sailors, [place] sent at 06/21/2018  1:36 PM EDT ----- Cholesterol is looking very well controlled. CMET stable. Continue all medications. When she drops off stool sample please have MA come to front desk, they have not been getting tested.

## 2018-06-21 NOTE — Telephone Encounter (Signed)
Patient advised as below. Patient verbalizes understanding and is in agreement with treatment plan.  

## 2018-07-05 ENCOUNTER — Ambulatory Visit: Payer: Self-pay

## 2018-07-05 ENCOUNTER — Telehealth: Payer: Self-pay

## 2018-07-05 DIAGNOSIS — E7800 Pure hypercholesterolemia, unspecified: Secondary | ICD-10-CM

## 2018-07-05 NOTE — Patient Instructions (Signed)
1. Contact the CCM team for assistance for community resource needs in the future.  CCM (Chronic Care Management) Team   Yvone Neu RN, BSN Nurse Care Coordinator  [phone]  Karalee Height PharmD  Clinical Pharmacist  [phone]

## 2018-07-05 NOTE — Progress Notes (Signed)
This encounter was created in error - please disregard.

## 2018-07-05 NOTE — Chronic Care Management (AMB) (Signed)
  Care Management   Follow Up Note   07/05/2018 Name: Verlee Pope MRN: [id number] DOB: 1961/05/23  Referred by: Trey Sailors, PA-C Reason for referral : Chronic Care Management (follow up for mammogram screening appointment)   Subjective: "I'm doing well" "I have a mammogram scheduled for December 9th"   Assessment: Referral placed to the care management team by Osvaldo Angst PA-C who requests assistance for Ms. Clovis Riley for a resource for breast cancer screening.  Goals Addressed    . COMPLETED: "I want to have breast cancer screening" (pt-stated)       Patient states she is doing well. She has a scheduled mammogram appointment at [facility] through the Breast and Cervical Cancer Control Program for December, 9 2019.  Clinical Goal(s): Over the next 14 days, patient will verbalize understanding of community breast cancer screening resource.   Interventions: Patient was assessed for additional community resource needs. CCM team contact information given for future assistance if needed.       Plan: The care management team is more than happy to provide assistance to Ms. Hession in the future.    Kyian Obst E. Suzie Portela, RN, BSN Nurse Care Coordinator [facility] Care [facility] [phone]

## 2018-07-05 NOTE — Chronic Care Management (AMB) (Addendum)
Erroneous note

## 2018-07-05 NOTE — Addendum Note (Signed)
Addended by: Yvone Neu E on: 07/05/2018 03:19 PM   Modules accepted: Level of Service, SmartSet

## 2018-07-09 ENCOUNTER — Encounter: Payer: Self-pay | Admitting: Physician Assistant

## 2018-07-09 DIAGNOSIS — I10 Essential (primary) hypertension: Secondary | ICD-10-CM

## 2018-07-09 LAB — HEMOCCULT GUIAC POC 1CARD (OFFICE)
Card #2 Fecal Occult Blod, POC: NEGATIVE
Fecal Occult Blood, POC: NEGATIVE

## 2018-07-09 LAB — FECAL OCCULT BLOOD, GUAIAC: Fecal Occult Blood: NEGATIVE

## 2018-07-09 NOTE — Progress Notes (Unsigned)
Patients daughter walked in office this afternoon to hand off two hemoccult cards. Tested both specimens and was negative for any signs of blood, see results ordered in chart. KW

## 2018-07-14 ENCOUNTER — Other Ambulatory Visit: Payer: Self-pay | Admitting: Physician Assistant

## 2018-07-14 DIAGNOSIS — I10 Essential (primary) hypertension: Secondary | ICD-10-CM

## 2018-08-01 ENCOUNTER — Other Ambulatory Visit: Payer: Self-pay

## 2018-08-01 ENCOUNTER — Ambulatory Visit
Admission: EM | Admit: 2018-08-01 | Discharge: 2018-08-01 | Disposition: A | Discharge status: Home / Self Care | Payer: Self-pay | Attending: Family Medicine | Admitting: Family Medicine

## 2018-08-01 ENCOUNTER — Encounter: Payer: Self-pay | Admitting: Gynecology

## 2018-08-01 DIAGNOSIS — K0889 Other specified disorders of teeth and supporting structures: Secondary | ICD-10-CM

## 2018-08-01 HISTORY — DX: Pure hypercholesterolemia, unspecified: E78.00

## 2018-08-01 MED ORDER — AMOXICILLIN-POT CLAVULANATE 875-125 MG PO TABS: 1.0000 | ORAL_TABLET | Freq: Two times a day (BID) | ORAL | 0 refills | Status: DC

## 2018-08-01 NOTE — ED Triage Notes (Signed)
Patient c/o left lower jaw tooth abscess / pain.

## 2018-08-01 NOTE — Discharge Instructions (Signed)
Take medication as prescribed. Rest. Drink plenty of fluids. Rinse mouth frequently. Follow up with your dentist as discussed.   Follow up with your primary care physician this week as needed. Return to Urgent care for new or worsening concerns.

## 2018-08-01 NOTE — ED Provider Notes (Addendum)
[facility] ____________________________________________  Time seen: Approximately 11:16 AM  I have reviewed the triage vital signs and the nursing notes.   HISTORY  Chief Complaint Dental Pain    HPI Jodi Martin is a 57 y.o. female presenting for evaluation of left lower dental tenderness present for the last few days.  States the area is mildly tender and feels mildly swollen.  However wants to be evaluated to make sure it does not get worse.  Patient reports some time ago a filling came out of her left lower molar and believes it may be now infected.  Denies fevers.  Reports able to continue to eat and drink well.  No alleviating measures attempted.  Denies aggravating factors.  Denies recent sickness, cough, congestion, sore throat, chest pain, shortness of breath.  No recent antibiotic use.  Reports otherwise feels well denies other complaints.  Trey SailorsPollak, Adriana M, PA-C: PCP   Past Medical History:  Diagnosis Date  . Hypercholesteremia   . Hypertension     Patient Active Problem List   Diagnosis Date Noted  . Hypertension 06/05/2017  . Hypercholesteremia 05/01/2017    Past Surgical History:  Procedure Laterality Date  . OTHER SURGICAL HISTORY     fallopian tube removal  . TUBAL LIGATION      Current Outpatient Rx  . Order #: [id number] Class: Normal  . Order #: [id number] Class: Historical Med  . Order #: [id number] Class: Normal  . Order #: [id number] Class: Normal  . Order #: [id number] Class: Normal    Allergies Patient has no known allergies.  Family History  Problem Relation Age of Onset  . Stroke Mother   . Hypertension Father   . Heart attack Paternal Aunt   . Stroke Maternal Grandmother   . Cancer Brother     Social History Social History   Tobacco Use  . Smoking status: Never Smoker  . Smokeless tobacco: Never Used  Substance Use Topics  . Alcohol use: No  . Drug use: No    Review of Systems Constitutional: No  fever/chills. Reports continues to eat and drink foods and fluids well.  ENT: No sore throat. Cardiovascular: Denies chest pain. Respiratory: Denies shortness of breath. Gastrointestinal: No abdominal pain.   Musculoskeletal: Negative for back pain. Skin: Negative for rash. ____________________________________________   PHYSICAL EXAM:  VITAL SIGNS: ED Triage Vitals  Enc Vitals Group     BP 08/01/18 0954 129/81     Pulse Rate 08/01/18 0954 76     Resp 08/01/18 0954 16     Temp 08/01/18 0954 98.2 F (36.8 C)     Temp Source 08/01/18 0954 Oral     SpO2 08/01/18 0954 100 %     Weight 08/01/18 0955 114 lb (51.7 kg)     Height 08/01/18 0955 5\' 3"  (1.6 m)     Head Circumference --      Peak Flow --      Pain Score 08/01/18 0953 0     Pain Loc --      Pain Edu? --      Excl. in GC? --     Constitutional: Alert and oriented. Well appearing and in no acute distress. Eyes: Conjunctivae are normal. Head: Atraumatic.  No facial swelling. Ears: Bilateral ears no erythema, normal TMs.  Nose: No congestion/rhinnorhea. Mouth/Throat: Mucous membranes are moist.  Oropharynx non-erythematous. Periodontal Exam    Left lower fractured tooth #18 with mild to moderate immediate lower gumline swelling and mild tenderness, no fluctuance, no drainage, no  visible abscess. Neck: No stridor.  Hematological/Lymphatic/Immunilogical: No cervical lymphadenopathy. Cardiovascular:   Normal rate, regular rhythm. Grossly normal heart sounds. Good peripheral circulation. Respiratory: Normal respiratory effort.  No retractions. Musculoskeletal: Steady gait.  Neurologic:  Normal speech and language.Speech is normal. No gait instability. Skin:  Skin is warm, dry and intact. No rash noted. Psychiatric: Mood and affect are normal. Speech and behavior are normal.  ____________________________________________   LABS (all labs ordered are listed, but only abnormal results are displayed)  Labs Reviewed - No  data to display ____________________________________________   INITIAL IMPRESSION / ASSESSMENT AND PLAN / ED COURSE  Pertinent labs & imaging results that were available during my care of the patient were reviewed by me and considered in my medical decision making (see chart for details).  Well-appearing patient.  No acute distress.  Suspect secondary dental infection.  Will treat with Augmentin.  Recommend close follow-up with dentist within 1 to 2 weeks.  Over-the-counter ibuprofen, frequent mouth rinsing.  Supportive care.  Discussed follow-up and return parameters.  Patient agreed with this plan.  ____________________________________________   FINAL CLINICAL IMPRESSION(S) / ED DIAGNOSES  Final diagnoses:  Pain, dental         Renford Dills, NP 08/01/18 1121

## 2018-08-16 ENCOUNTER — Ambulatory Visit
Admission: RE | Admit: 2018-08-16 | Discharge: 2018-08-16 | Disposition: A | Discharge status: Home / Self Care | Payer: Self-pay | Source: Ambulatory Visit | Attending: Oncology | Admitting: Oncology

## 2018-08-16 ENCOUNTER — Other Ambulatory Visit: Payer: Self-pay

## 2018-08-16 ENCOUNTER — Ambulatory Visit: Payer: Self-pay | Attending: Oncology

## 2018-08-16 ENCOUNTER — Ambulatory Visit
Admission: RE | Admit: 2018-08-16 | Discharge: 2018-08-16 | Disposition: A | Discharge status: Home / Self Care | Payer: Self-pay | Source: Ambulatory Visit | Attending: Physician Assistant | Admitting: Physician Assistant

## 2018-08-16 VITALS — BP 143/89 | HR 80 | Temp 97.2°F | Ht 65.0 in | Wt 112.0 lb

## 2018-08-16 DIAGNOSIS — Z0000 Encounter for general adult medical examination without abnormal findings: Secondary | ICD-10-CM | POA: Insufficient documentation

## 2018-08-16 DIAGNOSIS — N63 Unspecified lump in unspecified breast: Secondary | ICD-10-CM

## 2018-08-16 DIAGNOSIS — Z1239 Encounter for other screening for malignant neoplasm of breast: Secondary | ICD-10-CM | POA: Insufficient documentation

## 2018-08-16 NOTE — Progress Notes (Signed)
  Subjective:     Patient ID: Jodi Martin, female   DOB: 09-24-1960, 57 y.o.   MRN: [id number]  HPI   Review of Systems     Objective:   Physical Exam  Pulmonary/Chest: Right breast exhibits no inverted nipple, no mass, no nipple discharge, no skin change and no tenderness. Left breast exhibits no inverted nipple, no mass, no nipple discharge, no skin change and no tenderness. Breasts are symmetrical.       Assessment:     57 [facility].  Patient screened, and meets [facility] eligibility.  Patient does not have insurance, Medicare or Medicaid.  Handout given on Affordable Care Act. Instructed patient on breast self awareness using teach back method. Clinical breast exam unremarkable. No mass or lump palpated.  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  Risk Assessment    Risk Scores      08/16/2018   Last edited by: Scarlett PrestoShaver, Anne F, RN   5-year risk: 2 %   Lifetime risk: 11.4 %             Plan:     Sent for bilateral screening mammogram.

## 2018-08-26 ENCOUNTER — Ambulatory Visit: Payer: Self-pay

## 2018-09-03 ENCOUNTER — Ambulatory Visit
Admission: RE | Admit: 2018-09-03 | Discharge: 2018-09-03 | Disposition: A | Discharge status: Home / Self Care | Payer: Self-pay | Source: Ambulatory Visit | Attending: Oncology | Admitting: Oncology

## 2018-09-03 DIAGNOSIS — N63 Unspecified lump in unspecified breast: Secondary | ICD-10-CM | POA: Insufficient documentation

## 2018-09-08 ENCOUNTER — Other Ambulatory Visit: Payer: Self-pay | Admitting: Physician Assistant

## 2018-09-08 DIAGNOSIS — E7800 Pure hypercholesterolemia, unspecified: Secondary | ICD-10-CM

## 2018-09-15 ENCOUNTER — Other Ambulatory Visit: Payer: Self-pay

## 2018-09-15 DIAGNOSIS — N63 Unspecified lump in unspecified breast: Secondary | ICD-10-CM

## 2018-09-15 NOTE — Progress Notes (Signed)
Radiologist dicussed Birads 3 results with patient.  Scheduled 6 month follow-up mammogram and ultrasound for 03/07/19 at 10:20.  Mailed appointment info to patient. Copy to [facility].

## 2018-10-28 ENCOUNTER — Other Ambulatory Visit: Payer: Self-pay | Admitting: Physician Assistant

## 2018-10-28 DIAGNOSIS — I10 Essential (primary) hypertension: Secondary | ICD-10-CM

## 2018-12-09 ENCOUNTER — Other Ambulatory Visit: Payer: Self-pay | Admitting: Physician Assistant

## 2018-12-09 DIAGNOSIS — E7800 Pure hypercholesterolemia, unspecified: Secondary | ICD-10-CM

## 2018-12-09 NOTE — Telephone Encounter (Signed)
Please Review

## 2018-12-24 ENCOUNTER — Ambulatory Visit: Payer: Self-pay | Admitting: Physician Assistant

## 2019-01-08 ENCOUNTER — Other Ambulatory Visit: Payer: Self-pay | Admitting: Physician Assistant

## 2019-01-08 DIAGNOSIS — I10 Essential (primary) hypertension: Secondary | ICD-10-CM

## 2019-01-10 NOTE — Telephone Encounter (Signed)
L.O.V. 06/18/2018, please advise.

## 2019-01-20 ENCOUNTER — Other Ambulatory Visit: Payer: Self-pay | Admitting: Physician Assistant

## 2019-01-20 DIAGNOSIS — I10 Essential (primary) hypertension: Secondary | ICD-10-CM

## 2019-01-20 NOTE — Telephone Encounter (Signed)
L.O.V. 06/18/2018, please advise. 

## 2019-02-25 IMAGING — MG DIGITAL SCREENING BILATERAL MAMMOGRAM WITH TOMO AND CAD
6 of 10 series · 6 of 30 positions shown · non-contrast
Comparison: Previous exam(s).

CLINICAL DATA: Screening.

EXAM:
DIGITAL SCREENING BILATERAL MAMMOGRAM WITH TOMO AND CAD

[L CC synth-2D]
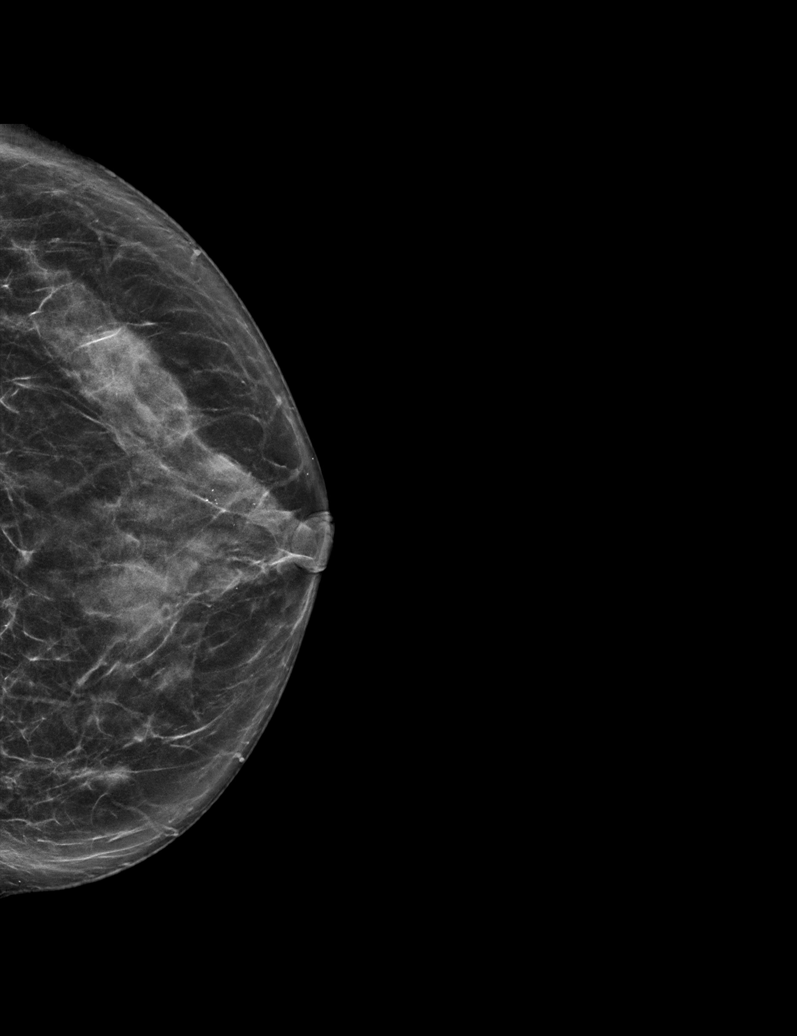

[R CC synth-2D]
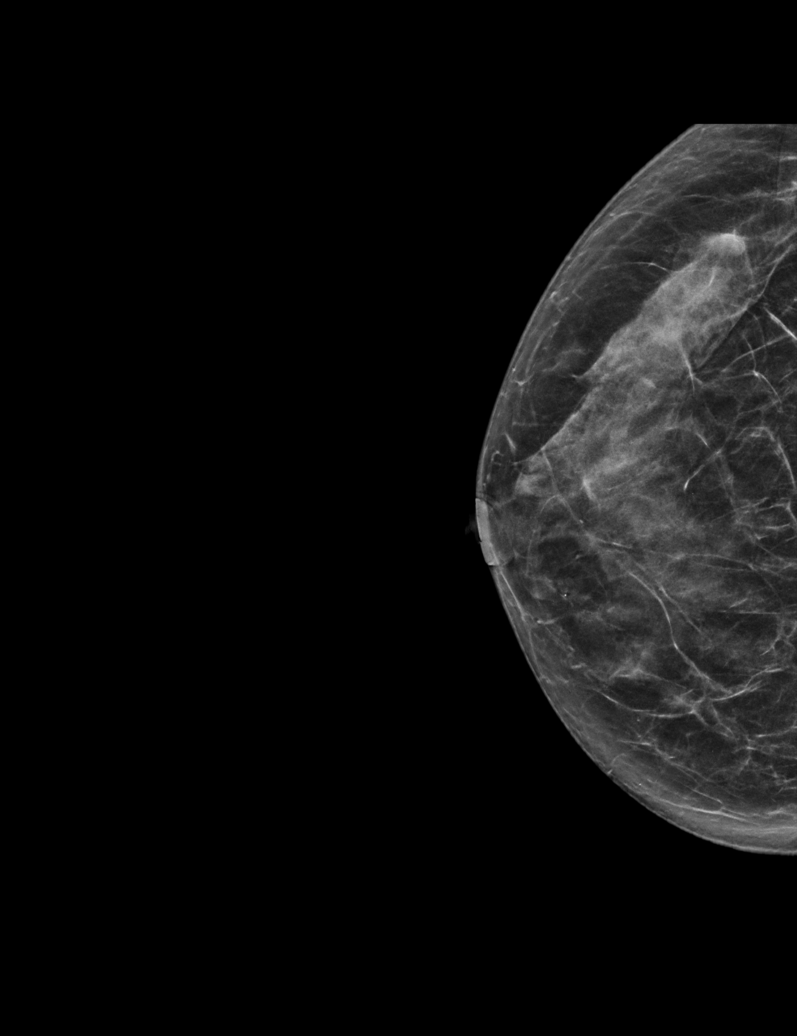

[L MLO synth-2D]
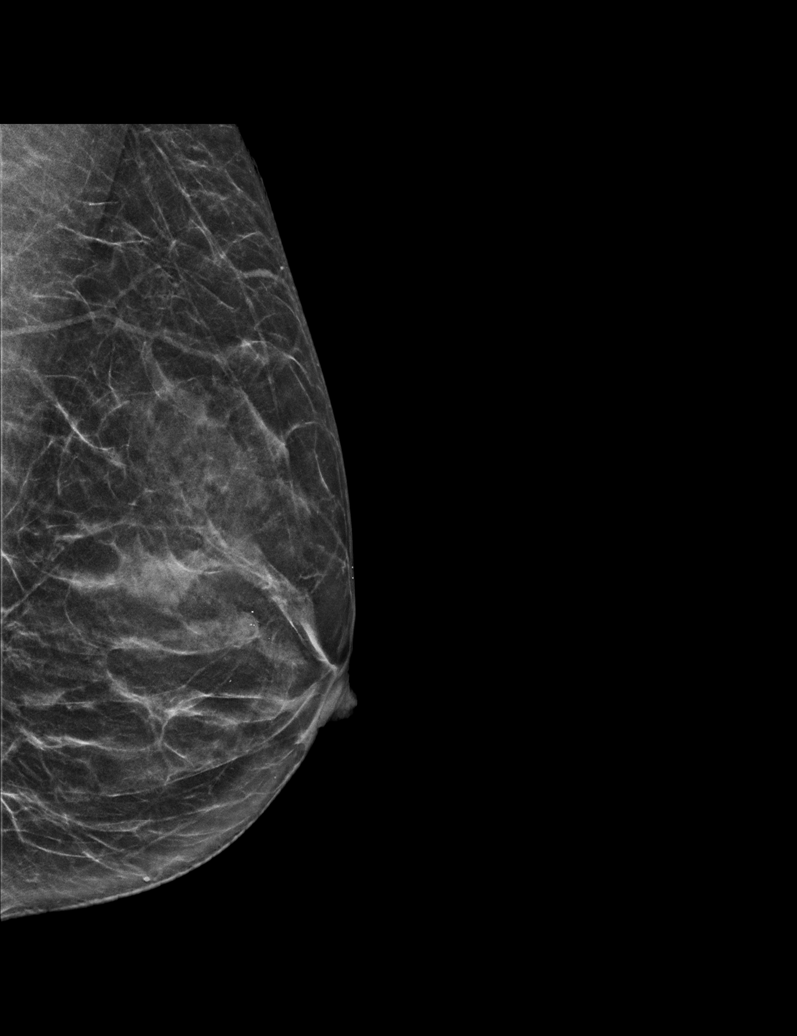

[R MLO synth-2D (1 of 2)]
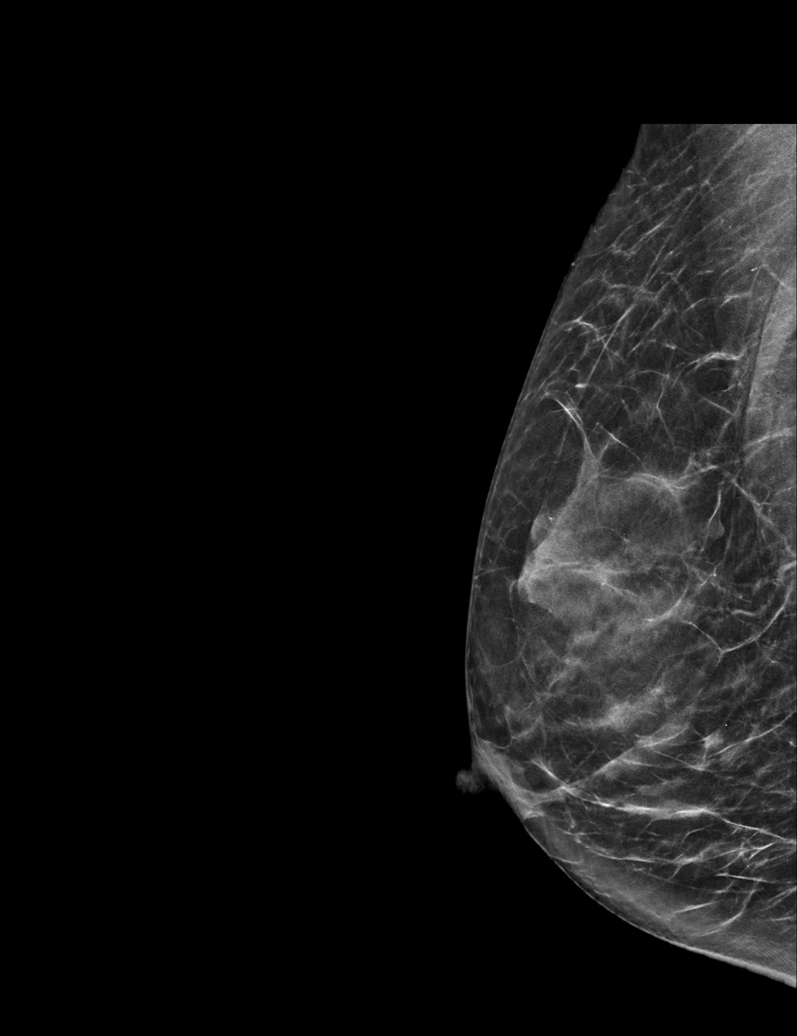

[R MLO synth-2D (2 of 2)]
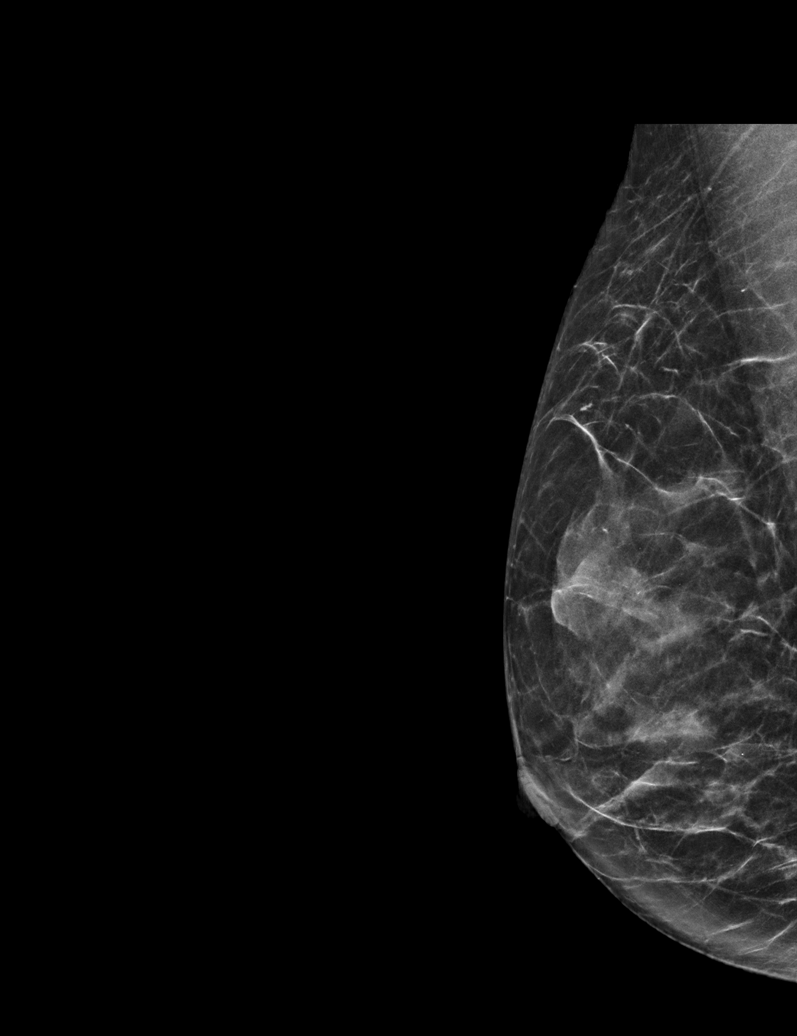

[R MLO tomo · tomo slice 28/55.0]
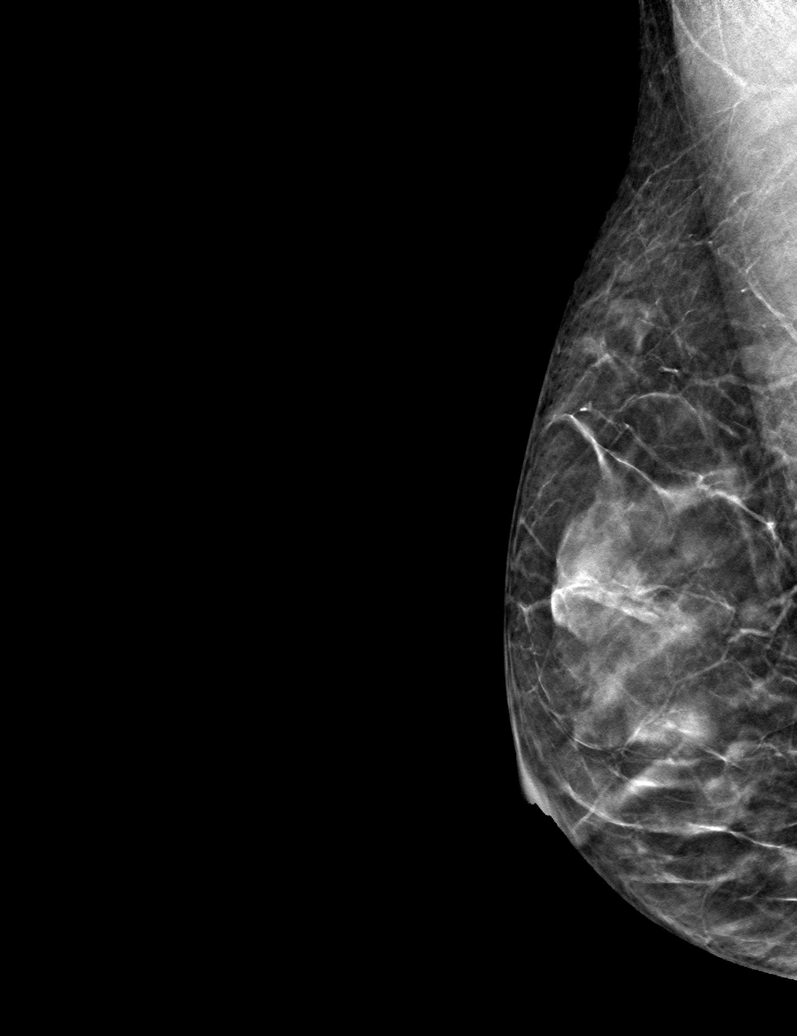

[6 of 30 positions shown; findings below may reference images not displayed]

ACR Breast Density Category c: The breast tissue is heterogeneously
dense, which may obscure small masses.
FINDINGS: In the right breast , a possible mass requires further evaluation.
This possible mass is seen within the outer RIGHT breast,
tomosynthesis CC slice 36.

In the left breast , a possible asymmetry requires further
evaluation. This possible asymmetry is seen within the inner LEFT
breast, tomosynthesis CC slice 30.

Images were processed with CAD.
IMPRESSION: Further evaluation is suggested for possible mass in the right
breast.

Further evaluation is suggested for possible asymmetry in the left
breast.

RECOMMENDATION:
Diagnostic mammogram and possibly ultrasound of both breasts.
(Code:[id number])

The patient will be contacted regarding the findings, and additional
imaging will be scheduled.

BI-RADS CATEGORY  0: Incomplete. Need additional imaging evaluation
and/or prior mammograms for comparison.

## 2019-03-07 ENCOUNTER — Other Ambulatory Visit: Payer: Self-pay

## 2019-03-07 ENCOUNTER — Ambulatory Visit
Admission: RE | Admit: 2019-03-07 | Discharge: 2019-03-07 | Disposition: A | Discharge status: Home / Self Care | Payer: Self-pay | Source: Ambulatory Visit | Attending: Oncology | Admitting: Oncology

## 2019-03-07 DIAGNOSIS — N63 Unspecified lump in unspecified breast: Secondary | ICD-10-CM

## 2019-03-08 NOTE — Progress Notes (Signed)
Patient ID: Jodi Martin, female   DOB: 1961/08/22, 58 y.o.   MRN: [id number] Birads 3  Six month follow-up mammogram results.  Scheduled to return 09/14/2019 for annual BCCCP and stability.  Mailed appointment reminder.

## 2019-04-07 ENCOUNTER — Other Ambulatory Visit: Payer: Self-pay | Admitting: Physician Assistant

## 2019-04-07 DIAGNOSIS — I10 Essential (primary) hypertension: Secondary | ICD-10-CM

## 2019-04-27 ENCOUNTER — Other Ambulatory Visit: Payer: Self-pay | Admitting: Physician Assistant

## 2019-04-27 DIAGNOSIS — I10 Essential (primary) hypertension: Secondary | ICD-10-CM

## 2019-06-09 ENCOUNTER — Other Ambulatory Visit: Payer: Self-pay | Admitting: Physician Assistant

## 2019-06-09 DIAGNOSIS — E7800 Pure hypercholesterolemia, unspecified: Secondary | ICD-10-CM

## 2019-06-09 NOTE — Telephone Encounter (Signed)
Last OV 06/2018.

## 2019-06-09 NOTE — Telephone Encounter (Signed)
Apt 07/01/2019 at 8:40  Thanks,   -Mickel Baas

## 2019-06-09 NOTE — Telephone Encounter (Signed)
Refilled. Please schedule OV, it's been > 1 year.

## 2019-07-01 ENCOUNTER — Ambulatory Visit (INDEPENDENT_AMBULATORY_CARE_PROVIDER_SITE_OTHER): Payer: Self-pay | Admitting: Physician Assistant

## 2019-07-01 ENCOUNTER — Encounter: Payer: Self-pay | Admitting: Physician Assistant

## 2019-07-01 ENCOUNTER — Other Ambulatory Visit: Payer: Self-pay

## 2019-07-01 VITALS — BP 133/83 | HR 64 | Temp 97.3°F | Resp 16 | Ht 63.0 in | Wt 114.0 lb

## 2019-07-01 DIAGNOSIS — Z23 Encounter for immunization: Secondary | ICD-10-CM

## 2019-07-01 DIAGNOSIS — E7800 Pure hypercholesterolemia, unspecified: Secondary | ICD-10-CM

## 2019-07-01 DIAGNOSIS — I10 Essential (primary) hypertension: Secondary | ICD-10-CM

## 2019-07-01 MED ORDER — LISINOPRIL 20 MG PO TABS: 20.0000 mg | ORAL_TABLET | Freq: Every day | ORAL | 3 refills | Status: DC

## 2019-07-01 MED ORDER — ATORVASTATIN CALCIUM 10 MG PO TABS: 10.0000 mg | ORAL_TABLET | Freq: Every day | ORAL | 3 refills | Status: DC

## 2019-07-01 MED ORDER — AMLODIPINE BESYLATE 10 MG PO TABS: 10.0000 mg | ORAL_TABLET | Freq: Every day | ORAL | 3 refills | Status: DC

## 2019-07-01 NOTE — Progress Notes (Signed)
Patient: Jodi Martin Female    DOB: 1961-07-23   58 y.o.   MRN: [id number] Visit Date: 07/01/2019  Today's Provider: Trinna Post, PA-C   Chief Complaint  Patient presents with  . Hypertension  . Hyperlipidemia   Subjective:   Mammo: 03/07/2019 with hypoechoic masses in left and right breasts, she is being followed by oncology.  PAP: 05/2016 normal and HPV negative  Colon Cancer Screening: annual FOBT was negative last year.   HPI  Hypertension, follow-up:  BP Readings from Last 3 Encounters:  07/01/19 133/83  08/16/18 (!) 143/89  08/01/18 129/81    She was last seen for hypertension 1 years ago.  BP at that visit was 120/80. Management changes since that visit include no changes. She continues to take amlodipine 10 mg QD without issue and lisinopril 20 mg without issue.  She reports excellent compliance with treatment. She is not having side effects.  She is not exercising. She is adherent to low salt diet.   Outside blood pressures are stable. She is experiencing none.  Patient denies chest pain.   Cardiovascular risk factors include dyslipidemia and hypertension.  Use of agents associated with hypertension: none.     Weight trend: stable Wt Readings from Last 3 Encounters:  07/01/19 114 lb (51.7 kg)  08/16/18 112 lb (50.8 kg)  08/01/18 114 lb (51.7 kg)    Current diet: in general, a "healthy" diet    ------------------------------------------------------------------------  Lipid/Cholesterol, Follow-up:   Last seen for this1 years ago.  Management changes since that visit include no changes. She continues to take Lipitor 10 mg QHS without issue.  . Last Lipid Panel:    Component Value Date/Time   CHOL 167 06/18/2018 0940   TRIG 33 06/18/2018 0940   HDL 73 06/18/2018 0940   CHOLHDL 2.3 06/18/2018 0940   LDLCALC 87 06/18/2018 0940    Risk factors for vascular disease include hypertension  She reports excellent compliance with  treatment. She is not having side effects.  Current symptoms include none and have been stable. Weight trend: stable Prior visit with dietician: no Current diet: in general, a "healthy" diet   Current exercise: housecleaning, walking and yard work  [facility] Readings from Last 3 Encounters:  07/01/19 114 lb (51.7 kg)  08/16/18 112 lb (50.8 kg)  08/01/18 114 lb (51.7 kg)    -------------------------------------------------------------------   No Known Allergies   Current Outpatient Medications:  .  amLODipine (NORVASC) 10 MG tablet, Take 1 tablet by mouth once daily, Disp: 90 tablet, Rfl: 0 .  Ascorbic Acid (VITAMIN C PO), Take by mouth., Disp: , Rfl:  .  atorvastatin (LIPITOR) 10 MG tablet, Take 1 tablet by mouth once daily, Disp: 90 tablet, Rfl: 0 .  lisinopril (ZESTRIL) 20 MG tablet, Take 1 tablet by mouth once daily, Disp: 90 tablet, Rfl: 0  Review of Systems  Constitutional: Negative.   Respiratory: Negative.   Cardiovascular: Negative.     Social History   Tobacco Use  . Smoking status: Never Smoker  . Smokeless tobacco: Never Used  Substance Use Topics  . Alcohol use: No      Objective:   BP 133/83 (BP Location: Left Arm, Patient Position: Sitting, Cuff Size: Normal)   Pulse 64   Temp (!) 97.3 F (36.3 C) (Temporal)   Resp 16   Ht 5\' 3"  (1.6 m)   Wt 114 lb (51.7 kg)   BMI 20.19 kg/m  Vitals:   07/01/19  0840  BP: 133/83  Pulse: 64  Resp: 16  Temp: (!) 97.3 F (36.3 C)  TempSrc: Temporal  Weight: 114 lb (51.7 kg)  Height: 5\' 3"  (1.6 m)  Body mass index is 20.19 kg/m.   Physical Exam Constitutional:      Appearance: Normal appearance.  Cardiovascular:     Rate and Rhythm: Normal rate and regular rhythm.     Heart sounds: Normal heart sounds.  Pulmonary:     Effort: Pulmonary effort is normal.     Breath sounds: Normal breath sounds.  Skin:    General: Skin is warm and dry.  Neurological:     Mental Status: She is alert and oriented to person,  place, and time. Mental status is at baseline.  Psychiatric:        Mood and Affect: Mood normal.        Behavior: Behavior normal.      No results found for any visits on 07/01/19.     Assessment & Plan    1. Hypercholesteremia  Will send her home with hemoccult card to come back when she was completed.   - Lipid Profile - Comprehensive Metabolic Panel (CMET) - atorvastatin (LIPITOR) 10 MG tablet; Take 1 tablet (10 mg total) by mouth daily.  Dispense: 90 tablet; Refill: 3  2. Hypertension, unspecified type  - Lipid Profile - Comprehensive Metabolic Panel (CMET) - amLODipine (NORVASC) 10 MG tablet; Take 1 tablet (10 mg total) by mouth daily.  Dispense: 90 tablet; Refill: 3 - lisinopril (ZESTRIL) 20 MG tablet; Take 1 tablet (20 mg total) by mouth daily.  Dispense: 90 tablet; Refill: 3  3. Need for influenza vaccination  - Flu Vaccine QUAD 36+ mos IM  The entirety of the information documented in the History of Present Illness, Review of Systems and Physical Exam were personally obtained by me. Portions of this information were initially documented by 07/03/19, CMA and reviewed by me for thoroughness and accuracy.   F/u 1 year            Rondel Baton, PA-C  [facility]

## 2019-07-02 LAB — LIPID PANEL
Chol/HDL Ratio: 2.6 ratio (ref 0.0–4.4)
Cholesterol, Total: 188 mg/dL (ref 100–199)
HDL: 73 mg/dL (ref >39)
LDL Chol Calc (NIH): 105 mg/dL — ABNORMAL HIGH (ref 0–99)
Triglycerides: 53 mg/dL (ref 0–149)
VLDL Cholesterol Cal: 10 mg/dL (ref 5–40)

## 2019-07-02 LAB — COMPREHENSIVE METABOLIC PANEL WITH GFR
ALT: 8 [IU]/L (ref 0–32)
AST: 16 [IU]/L (ref 0–40)
Albumin/Globulin Ratio: 1.8 (ref 1.2–2.2)
Albumin: 4.7 g/dL (ref 3.8–4.9)
Alkaline Phosphatase: 73 [IU]/L (ref 39–117)
BUN/Creatinine Ratio: 16 (ref 9–23)
BUN: 15 mg/dL (ref 6–24)
Bilirubin Total: 0.9 mg/dL (ref 0.0–1.2)
CO2: 24 mmol/L (ref 20–29)
Calcium: 9.7 mg/dL (ref 8.7–10.2)
Chloride: 104 mmol/L (ref 96–106)
Creatinine, Ser: 0.93 mg/dL (ref 0.57–1.00)
GFR calc Af Amer: 78 mL/min/{1.73_m2}
GFR calc non Af Amer: 67 mL/min/{1.73_m2}
Globulin, Total: 2.6 g/dL (ref 1.5–4.5)
Glucose: 81 mg/dL (ref 65–99)
Potassium: 3.9 mmol/L (ref 3.5–5.2)
Sodium: 139 mmol/L (ref 134–144)
Total Protein: 7.3 g/dL (ref 6.0–8.5)

## 2019-07-04 ENCOUNTER — Telehealth: Payer: Self-pay

## 2019-07-04 NOTE — Telephone Encounter (Signed)
Patient was advised and will bring in cards.

## 2019-07-04 NOTE — Telephone Encounter (Signed)
-----   Message from Trinna Post, [place] sent at 07/04/2019  2:39 PM EDT ----- Labs look great, continue medications and don't forget to bring back in hemoccult card for colon cancer screening.

## 2019-07-15 ENCOUNTER — Other Ambulatory Visit (INDEPENDENT_AMBULATORY_CARE_PROVIDER_SITE_OTHER): Payer: Self-pay | Admitting: Physician Assistant

## 2019-07-15 DIAGNOSIS — Z1211 Encounter for screening for malignant neoplasm of colon: Secondary | ICD-10-CM

## 2019-07-15 LAB — HEMOCCULT GUIAC POC 1CARD (OFFICE): Fecal Occult Blood, POC: NEGATIVE

## 2019-09-13 NOTE — Progress Notes (Deleted)
Called patient to prescreen for her [facility] appointment tomorrow.  States she would like to cancel and reschedule for a later date.  Message sent to Joellyn Quails to reschedule patient.

## 2019-09-14 ENCOUNTER — Ambulatory Visit: Payer: Self-pay

## 2020-04-22 ENCOUNTER — Other Ambulatory Visit: Payer: Self-pay

## 2020-04-22 ENCOUNTER — Encounter: Payer: Self-pay | Admitting: Intensive Care

## 2020-04-22 ENCOUNTER — Emergency Department
Admission: EM | Admit: 2020-04-22 | Discharge: 2020-04-22 | Disposition: A | Discharge status: Home / Self Care | Payer: Self-pay | Attending: Emergency Medicine | Admitting: Emergency Medicine

## 2020-04-22 ENCOUNTER — Emergency Department: Payer: Self-pay

## 2020-04-22 DIAGNOSIS — Z041 Encounter for examination and observation following transport accident: Secondary | ICD-10-CM | POA: Insufficient documentation

## 2020-04-22 DIAGNOSIS — Z79899 Other long term (current) drug therapy: Secondary | ICD-10-CM | POA: Insufficient documentation

## 2020-04-22 DIAGNOSIS — Y92410 Unspecified street and highway as the place of occurrence of the external cause: Secondary | ICD-10-CM | POA: Insufficient documentation

## 2020-04-22 DIAGNOSIS — S161XXA Strain of muscle, fascia and tendon at neck level, initial encounter: Secondary | ICD-10-CM | POA: Insufficient documentation

## 2020-04-22 DIAGNOSIS — Y9389 Activity, other specified: Secondary | ICD-10-CM | POA: Insufficient documentation

## 2020-04-22 DIAGNOSIS — I10 Essential (primary) hypertension: Secondary | ICD-10-CM | POA: Insufficient documentation

## 2020-04-22 DIAGNOSIS — Y999 Unspecified external cause status: Secondary | ICD-10-CM | POA: Insufficient documentation

## 2020-04-22 DIAGNOSIS — S0990XA Unspecified injury of head, initial encounter: Secondary | ICD-10-CM | POA: Insufficient documentation

## 2020-04-22 MED ORDER — ACETAMINOPHEN 500 MG PO TABS
1000.0000 mg | ORAL_TABLET | Freq: Once | ORAL | Status: AC
  Administered 2020-04-22: 1000 mg via ORAL
  Filled 2020-04-22: qty 2

## 2020-04-22 MED ORDER — CYCLOBENZAPRINE HCL 5 MG PO TABS: 5.0000 mg | ORAL_TABLET | Freq: Three times a day (TID) | ORAL | 0 refills | Status: DC | PRN

## 2020-04-22 MED ORDER — CYCLOBENZAPRINE HCL 10 MG PO TABS
10.0000 mg | ORAL_TABLET | Freq: Once | ORAL | Status: AC
  Administered 2020-04-22: 10 mg via ORAL
  Filled 2020-04-22: qty 1
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
## 2020-04-22 NOTE — Discharge Instructions (Addendum)
Please take Tylenol as needed for mild to moderate pain and Flexeril as needed for muscle tightness.  Apply ice to the neck and lower back muscles 20 minutes every hour over the next 2 days then transition over to heat.  Avoid any heavy lifting pushing or pulling.  Follow-up with orthopedics if no improvement 1 week.  Return to the ER for any worsening symptoms or urgent changes in your health.
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
## 2020-04-26 ENCOUNTER — Encounter: Payer: Self-pay | Admitting: Physician Assistant

## 2020-04-26 ENCOUNTER — Other Ambulatory Visit: Payer: Self-pay | Admitting: Physician Assistant

## 2020-04-26 ENCOUNTER — Ambulatory Visit (INDEPENDENT_AMBULATORY_CARE_PROVIDER_SITE_OTHER): Payer: Self-pay | Admitting: Physician Assistant

## 2020-04-26 ENCOUNTER — Other Ambulatory Visit: Payer: Self-pay

## 2020-04-26 DIAGNOSIS — I10 Essential (primary) hypertension: Secondary | ICD-10-CM

## 2020-04-26 DIAGNOSIS — E7800 Pure hypercholesterolemia, unspecified: Secondary | ICD-10-CM

## 2020-04-26 DIAGNOSIS — M542 Cervicalgia: Secondary | ICD-10-CM

## 2020-04-26 DIAGNOSIS — Z1231 Encounter for screening mammogram for malignant neoplasm of breast: Secondary | ICD-10-CM

## 2020-04-26 DIAGNOSIS — Z1211 Encounter for screening for malignant neoplasm of colon: Secondary | ICD-10-CM

## 2020-04-27 LAB — LIPID PANEL
Chol/HDL Ratio: 2.7 ratio (ref 0.0–4.4)
Cholesterol, Total: 194 mg/dL (ref 100–199)
HDL: 72 mg/dL (ref >39)
LDL Chol Calc (NIH): 114 mg/dL — ABNORMAL HIGH (ref 0–99)
Triglycerides: 43 mg/dL (ref 0–149)
VLDL Cholesterol Cal: 8 mg/dL (ref 5–40)

## 2020-04-27 LAB — COMPREHENSIVE METABOLIC PANEL
ALT: 10 IU/L (ref 0–32)
AST: 18 IU/L (ref 0–40)
Albumin/Globulin Ratio: 1.7 (ref 1.2–2.2)
Albumin: 4.7 g/dL (ref 3.8–4.9)
Alkaline Phosphatase: 76 IU/L (ref 48–121)
BUN/Creatinine Ratio: 19 (ref 12–28)
BUN: 17 mg/dL (ref 8–27)
Bilirubin Total: 0.6 mg/dL (ref 0.0–1.2)
CO2: 25 mmol/L (ref 20–29)
Calcium: 9.7 mg/dL (ref 8.7–10.3)
Chloride: 104 mmol/L (ref 96–106)
Creatinine, Ser: 0.89 mg/dL (ref 0.57–1.00)
GFR calc Af Amer: 81 mL/min/{1.73_m2} (ref >59)
GFR calc non Af Amer: 71 mL/min/{1.73_m2} (ref >59)
Globulin, Total: 2.7 g/dL (ref 1.5–4.5)
Glucose: 91 mg/dL (ref 65–99)
Potassium: 3.9 mmol/L (ref 3.5–5.2)
Sodium: 141 mmol/L (ref 134–144)
Total Protein: 7.4 g/dL (ref 6.0–8.5)

## 2020-04-27 LAB — CBC WITH DIFFERENTIAL/PLATELET
Basophils Absolute: 0.1 10*3/uL (ref 0.0–0.2)
Basos: 2 %
EOS (ABSOLUTE): 0.2 10*3/uL (ref 0.0–0.4)
Eos: 4 %
Hematocrit: 35.9 % (ref 34.0–46.6)
Hemoglobin: 12 g/dL (ref 11.1–15.9)
Immature Grans (Abs): 0 10*3/uL (ref 0.0–0.1)
Immature Granulocytes: 0 %
Lymphocytes Absolute: 1.3 10*3/uL (ref 0.7–3.1)
Lymphs: 32 %
MCH: 29.3 pg (ref 26.6–33.0)
MCHC: 33.4 g/dL (ref 31.5–35.7)
MCV: 88 fL (ref 79–97)
Monocytes Absolute: 0.4 10*3/uL (ref 0.1–0.9)
Monocytes: 10 %
Neutrophils Absolute: 2.1 10*3/uL (ref 1.4–7.0)
Neutrophils: 52 %
Platelets: 346 10*3/uL (ref 150–450)
RBC: 4.09 x10E6/uL (ref 3.77–5.28)
RDW: 13.2 % (ref 11.7–15.4)
WBC: 4.1 10*3/uL (ref 3.4–10.8)

## 2020-04-27 MED ORDER — ATORVASTATIN CALCIUM 20 MG PO TABS: 20.0000 mg | ORAL_TABLET | Freq: Every day | ORAL | 3 refills | Status: DC

## 2020-05-09 ENCOUNTER — Telehealth: Payer: Self-pay

## 2020-05-09 DIAGNOSIS — E7800 Pure hypercholesterolemia, unspecified: Secondary | ICD-10-CM

## 2020-05-09 MED ORDER — ATORVASTATIN CALCIUM 40 MG PO TABS: 40.0000 mg | ORAL_TABLET | Freq: Every day | ORAL | 3 refills | Status: DC

## 2020-05-09 NOTE — Telephone Encounter (Signed)
Advised patient as below. Medication was sent into the pharmacy.  

## 2020-05-09 NOTE — Telephone Encounter (Signed)
-----   Message from Trey Sailors, [place] sent at 05/07/2020  2:05 PM EDT ----- Labs are normal except for cholesterol which is a little above goal. Would recommend taking Lipitor 40 mg QHS. Can take two of her current tablets nightly until she gets the new one. If patient agreeable, would send in Lipitor 40 mg QHS #90 with 3 refills for her and we can see her next year.

## 2020-07-05 ENCOUNTER — Other Ambulatory Visit: Payer: Self-pay | Admitting: Physician Assistant

## 2020-07-05 DIAGNOSIS — E7800 Pure hypercholesterolemia, unspecified: Secondary | ICD-10-CM

## 2020-07-05 DIAGNOSIS — I10 Essential (primary) hypertension: Secondary | ICD-10-CM

## 2020-07-19 ENCOUNTER — Other Ambulatory Visit: Payer: Self-pay | Admitting: Physician Assistant

## 2020-07-19 DIAGNOSIS — I10 Essential (primary) hypertension: Secondary | ICD-10-CM

## 2020-10-08 ENCOUNTER — Other Ambulatory Visit: Payer: Self-pay | Admitting: Physician Assistant

## 2020-10-08 DIAGNOSIS — I10 Essential (primary) hypertension: Secondary | ICD-10-CM

## 2021-01-07 ENCOUNTER — Telehealth: Payer: Self-pay

## 2021-01-07 DIAGNOSIS — I10 Essential (primary) hypertension: Secondary | ICD-10-CM

## 2021-01-07 NOTE — Telephone Encounter (Signed)
[facility] faxed refill request for the following medications:  lisinopril (ZESTRIL) 20 MG tablet  amLODipine (NORVASC) 10 MG tablet    Please advise.

## 2021-01-08 MED ORDER — AMLODIPINE BESYLATE 10 MG PO TABS: 1.0000 | ORAL_TABLET | Freq: Every day | ORAL | 0 refills | Status: DC

## 2021-01-08 MED ORDER — LISINOPRIL 20 MG PO TABS: 1.0000 | ORAL_TABLET | Freq: Every day | ORAL | 0 refills | Status: DC

## 2021-04-05 ENCOUNTER — Other Ambulatory Visit: Payer: Self-pay | Admitting: Family Medicine

## 2021-04-05 DIAGNOSIS — I10 Essential (primary) hypertension: Secondary | ICD-10-CM

## 2021-06-20 ENCOUNTER — Ambulatory Visit: Payer: Self-pay | Admitting: Family Medicine

## 2021-07-04 ENCOUNTER — Other Ambulatory Visit: Payer: Self-pay | Admitting: Family Medicine

## 2021-07-04 DIAGNOSIS — I10 Essential (primary) hypertension: Secondary | ICD-10-CM
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
## 2021-07-07 ENCOUNTER — Other Ambulatory Visit: Payer: Self-pay | Admitting: Family Medicine

## 2021-07-07 DIAGNOSIS — I10 Essential (primary) hypertension: Secondary | ICD-10-CM
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
## 2021-07-12 ENCOUNTER — Telehealth: Payer: Self-pay | Admitting: Physician Assistant

## 2021-07-12 DIAGNOSIS — E7800 Pure hypercholesterolemia, unspecified: Secondary | ICD-10-CM

## 2021-07-12 MED ORDER — ATORVASTATIN CALCIUM 40 MG PO TABS: 40.0000 mg | ORAL_TABLET | Freq: Every day | ORAL | 0 refills | Status: DC

## 2021-07-12 NOTE — Telephone Encounter (Signed)
[facility] faxed refill request for the following medications:   atorvastatin (LIPITOR) 40 MG tablet   Please advise.  

## 2021-09-17 ENCOUNTER — Other Ambulatory Visit: Payer: Self-pay | Admitting: Family Medicine

## 2021-09-17 DIAGNOSIS — I10 Essential (primary) hypertension: Secondary | ICD-10-CM
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
## 2021-09-20 ENCOUNTER — Ambulatory Visit: Payer: Self-pay | Admitting: Physician Assistant

## 2021-09-20 ENCOUNTER — Ambulatory Visit: Payer: Self-pay | Admitting: Family Medicine

## 2021-09-30 ENCOUNTER — Other Ambulatory Visit: Payer: Self-pay | Admitting: Family Medicine

## 2021-09-30 DIAGNOSIS — I10 Essential (primary) hypertension: Secondary | ICD-10-CM

## 2021-09-30 DIAGNOSIS — E7800 Pure hypercholesterolemia, unspecified: Secondary | ICD-10-CM

## 2021-10-02 ENCOUNTER — Other Ambulatory Visit: Payer: Self-pay | Admitting: Family Medicine

## 2021-10-02 DIAGNOSIS — I10 Essential (primary) hypertension: Secondary | ICD-10-CM
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
## 2021-10-11 ENCOUNTER — Other Ambulatory Visit: Payer: Self-pay

## 2021-10-11 ENCOUNTER — Ambulatory Visit: Payer: Self-pay | Admitting: Physician Assistant

## 2021-10-11 ENCOUNTER — Encounter: Payer: Self-pay | Admitting: Physician Assistant

## 2021-10-11 VITALS — BP 135/83 | HR 77 | Temp 97.7°F | Resp 16 | Ht 63.0 in | Wt 118.0 lb

## 2021-10-11 DIAGNOSIS — E7800 Pure hypercholesterolemia, unspecified: Secondary | ICD-10-CM

## 2021-10-11 DIAGNOSIS — L209 Atopic dermatitis, unspecified: Secondary | ICD-10-CM | POA: Insufficient documentation

## 2021-10-11 DIAGNOSIS — I10 Essential (primary) hypertension: Secondary | ICD-10-CM

## 2021-10-11 DIAGNOSIS — Z0189 Encounter for other specified special examinations: Secondary | ICD-10-CM

## 2021-10-11 DIAGNOSIS — Z23 Encounter for immunization: Secondary | ICD-10-CM

## 2021-10-11 MED ORDER — SHINGRIX 50 MCG/0.5ML IM SUSR: 0.5000 mL | Freq: Once | INTRAMUSCULAR | 0 refills | Status: AC

## 2021-10-11 MED ORDER — TRIAMCINOLONE ACETONIDE 0.025 % EX CREA: 1.0000 "application " | TOPICAL_CREAM | Freq: Two times a day (BID) | CUTANEOUS | 2 refills | Status: DC

## 2021-10-11 NOTE — Assessment & Plan Note (Signed)
Chronic, historic condition, stable and well managed with lipitor  Patient and daughter express concerns for being on this medication when her lipid panel was largely benign in 2021 Patient agrees with repeat lab work to determine current lipid levels and making potential adjustments as dictated by results Continue current medications pending results of lipid panel

## 2021-10-11 NOTE — Patient Instructions (Signed)
I will update you with the results of your lab work and if we can reduce the dose of your statin  Please continue your medications as directed to help keep your blood pressure in goal  I believe your skin concerns are due to something called Atopic dermatitis I have included information about this condition in your summary and a mild steroid cream for you to apply to the affected areas  Please review the included information about colon cancer screenings so you can discuss options with Lillia Abed at your follow up.   It was nice to meet you and I appreciate the opportunity to be involved in your care

## 2021-10-11 NOTE — Assessment & Plan Note (Signed)
Acute, new problem, stable but unmanaged Recommended refraining from triggering substances and irritants Provided education on atopic dermatitis and ways to manage this  Provided low-potency kenalog for topical management  Follow up as needed to assess response

## 2021-10-11 NOTE — Assessment & Plan Note (Signed)
Chronic, historic condition, stable  Currently well managed with medications  Continue current medications as prescribed

## 2021-10-11 NOTE — Progress Notes (Signed)
Established patient visit  I,April Miller,acting as a scribe for [employer], PA-C.,have documented all relevant documentation on the behalf of Sharnice Bosler E Lizbet Cirrincione, PA-C,as directed by  Denny Peon E Emmaus Brandi, PA-C while in the presence of Tamasha Laplante E Hisham Provence, PA-C.   Patient: Jodi Martin   DOB: [birth year]   61 y.o. Female  MRN: [id number] Visit Date: 10/11/2021  Today's healthcare provider: Oswaldo Conroy Kyaire Gruenewald, PA-C  Introduced myself to the patient as a Secondary school teacher and provided education on APPs in clinical practice.     Chief Complaint  Patient presents with   Follow-up   Hypertension   Subjective    HPI  Hypertension, follow-up  BP Readings from Last 3 Encounters:  10/11/21 135/83  04/26/20 129/84  04/22/20 117/85   Wt Readings from Last 3 Encounters:  10/11/21 118 lb (53.5 kg)  04/26/20 117 lb 4.8 oz (53.2 kg)  04/22/20 115 lb (52.2 kg)     She was last seen for hypertension 04/26/2020.  BP at that visit was 129/84.  Management since that visit includes; well controlled. Taking amlodipine and lisinopril.  She reports fair compliance with treatment. She is not having side effects. none She is following a Regular diet. She is exercising. She does not smoke.  Use of agents associated with hypertension: none.   Outside blood pressures are not checking.  Pertinent labs: Lab Results  Component Value Date   CHOL 194 04/26/2020   HDL 72 04/26/2020   LDLCALC 114 (H) 04/26/2020   TRIG 43 04/26/2020   CHOLHDL 2.7 04/26/2020   Lab Results  Component Value Date   NA 141 04/26/2020   K 3.9 04/26/2020   CREATININE 0.89 04/26/2020   GFRNONAA 71 04/26/2020   GLUCOSE 91 04/26/2020     The 10-year ASCVD risk score (Arnett DK, et al., 2019) is: 7.1%   ---------------------------------------------------------------------------------------------------   She states she is not taking BP at home She states she is staying active throughout the week and will take walks as able  States  she has concerns for being on such a "high dose statin" when her cholesterol numbers did not seem that elevated  She states she does have intermittent cramping in bilateral hands and feet since starting Lipitor Discussed the reasoning for this and need to repeat labs before we make adjustments to medications regimen  Atopic Dermatitis Patient has concerns for new onset rash and skin changes on her right elbow and scalp  States this started a few weeks ago  We discussed recent changes to her daily routines- she reports she went to get her hair done and her stylist used a new relaxer formulation and this may be a cause of the dermatitis She denies urticaria and dermatographia today    Medications: Outpatient Medications Prior to Visit  Medication Sig   amLODipine (NORVASC) 10 MG tablet Take 1 tablet by mouth once daily   Ascorbic Acid (VITAMIN C PO) Take by mouth.   atorvastatin (LIPITOR) 40 MG tablet Take 1 tablet by mouth once daily   lisinopril (ZESTRIL) 20 MG tablet Take 1 tablet by mouth once daily   [DISCONTINUED] cyclobenzaprine (FLEXERIL) 5 MG tablet Take 1-2 tablets (5-10 mg total) by mouth 3 (three) times daily as needed for muscle spasms. (Patient not taking: Reported on 04/26/2020)   No facility-administered medications prior to visit.    Review of Systems  Constitutional:  Negative for appetite change, chills, fatigue and fever.  Respiratory:  Negative for chest tightness and  shortness of breath.   Cardiovascular:  Negative for chest pain, palpitations and leg swelling.  Gastrointestinal:  Negative for abdominal pain, diarrhea, nausea and vomiting.  Neurological:  Positive for light-headedness (orthostatic in description). Negative for dizziness, syncope, weakness and headaches.      Objective    BP 135/83 (BP Location: Right Arm, Patient Position: Sitting, Cuff Size: Normal)    Pulse 77    Temp 97.7 F (36.5 C) (Temporal)    Resp 16    Ht 5\' 3"  (1.6 m)    Wt 118 lb (53.5  kg)    SpO2 98%    BMI 20.90 kg/m  {Show previous vital signs (optional):23777}  Physical Exam Vitals reviewed.  Constitutional:      Appearance: Normal appearance. She is normal weight.  HENT:     Head: Normocephalic and atraumatic.  Eyes:     Conjunctiva/sclera: Conjunctivae normal.     Pupils: Pupils are equal, round, and reactive to light.  Cardiovascular:     Rate and Rhythm: Normal rate and regular rhythm.     Pulses: Normal pulses.     Heart sounds: Normal heart sounds.  Pulmonary:     Effort: Pulmonary effort is normal.     Breath sounds: Normal breath sounds. No wheezing, rhonchi or rales.  Musculoskeletal:     Cervical back: Normal range of motion and neck supple.  Skin:    General: Skin is warm.     Findings: Rash present. Rash is macular, papular and scaling.     Comments: Maculopapular scaling rash noted along occipital hairline  Hyperpigmentation and lichenification noted along dorsal aspect of right elbow and in flexure areas of neck.    Neurological:     Mental Status: She is alert.      No results found for any visits on 10/11/21.  Assessment & Plan      Problem List Items Addressed This Visit       Cardiovascular and Mediastinum   Hypertension    Chronic, historic condition, stable  Currently well managed with medications  Continue current medications as prescribed       Relevant Orders   CBC w/Diff/Platelet     Musculoskeletal and Integument   Atopic dermatitis in adult    Acute, new problem, stable but unmanaged Recommended refraining from triggering substances and irritants Provided education on atopic dermatitis and ways to manage this  Provided low-potency kenalog for topical management  Follow up as needed to assess response       Relevant Medications   triamcinolone (KENALOG) 0.025 % cream     Other   Hypercholesteremia - Primary    Chronic, historic condition, stable and well managed with lipitor  Patient and daughter  express concerns for being on this medication when her lipid panel was largely benign in 2021 Patient agrees with repeat lab work to determine current lipid levels and making potential adjustments as dictated by results Continue current medications pending results of lipid panel       Relevant Orders   Lipid Profile   Other Visit Diagnoses     Routine lab draw       Relevant Orders   Comprehensive Metabolic Panel (CMET)   Lipid Profile   CBC w/Diff/Platelet   Need for shingles vaccine       Relevant Medications   Zoster Vaccine Adjuvanted [facility](SHINGRIX) injection        Return in about 3 months (around 01/08/2022).   I, Malijah Lietz E Jeyren Danowski, PA-C, have  reviewed all documentation for this visit. The documentation on 10/11/21 for the exam, diagnosis, procedures, and orders are all accurate and complete.   Ernesta Trabert, Mirian Mo MPH [facility]  Roselind Messier  [facility] [phone] (phone) [phone] (fax)  [facility]

## 2021-10-13 ENCOUNTER — Other Ambulatory Visit: Payer: Self-pay | Admitting: Physician Assistant

## 2021-10-13 DIAGNOSIS — I10 Essential (primary) hypertension: Secondary | ICD-10-CM
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
## 2021-10-14 NOTE — Telephone Encounter (Signed)
Called patient and patient completely out of medication.

## 2021-10-15 NOTE — Telephone Encounter (Signed)
Patient last offive visit was 10/11/21, prescription was last filled 09/30/21 for a qty of 4 tablets. Will send in prescription with additional refills. KW

## 2021-12-16 ENCOUNTER — Other Ambulatory Visit: Payer: Self-pay | Admitting: Physician Assistant

## 2021-12-16 DIAGNOSIS — I10 Essential (primary) hypertension: Secondary | ICD-10-CM
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
## 2022-01-06 ENCOUNTER — Other Ambulatory Visit: Payer: Self-pay | Admitting: Physician Assistant

## 2022-01-06 DIAGNOSIS — E7800 Pure hypercholesterolemia, unspecified: Secondary | ICD-10-CM
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
## 2022-01-10 ENCOUNTER — Ambulatory Visit: Payer: Self-pay | Admitting: Physician Assistant

## 2022-01-24 ENCOUNTER — Ambulatory Visit: Payer: Self-pay | Admitting: Physician Assistant

## 2022-02-11 ENCOUNTER — Other Ambulatory Visit: Payer: Self-pay | Admitting: Physician Assistant

## 2022-02-11 DIAGNOSIS — I10 Essential (primary) hypertension: Secondary | ICD-10-CM

## 2022-03-06 NOTE — Progress Notes (Signed)
I,Sha'taria Tyson,acting as a Neurosurgeon for [employer], PA-C.,have documented all relevant documentation on the behalf of Alfredia Ferguson, PA-C,as directed by  Alfredia Ferguson, PA-C while in the presence of Alfredia Ferguson, PA-C.   Established patient visit   Patient: Jodi Martin   DOB: 1960/11/02   61 y.o. Female  MRN: [id number] Visit Date: 03/07/2022  Today's healthcare provider: Alfredia Ferguson, PA-C   Cc. Htn, hld f/u  Subjective    HPI  Hypertension, follow-up  BP Readings from Last 3 Encounters:  03/07/22 129/88  10/11/21 135/83  04/26/20 129/84   Wt Readings from Last 3 Encounters:  03/07/22 119 lb 12.8 oz (54.3 kg)  10/11/21 118 lb (53.5 kg)  04/26/20 117 lb 4.8 oz (53.2 kg)     She was last seen for hypertension 4 months ago.  BP at that visit was 135/83. Management since that visit includes continue current medication.  She reports excellent compliance with treatment. She is not having side effects.  She is following a Low Sodium diet. She is exercising. She does not smoke.  Use of agents associated with hypertension: none.   Outside blood pressures are not being checked Symptoms: No chest pain No chest pressure  No palpitations No syncope  No dyspnea No orthopnea  No paroxysmal nocturnal dyspnea No lower extremity edema   Pertinent labs Lab Results  Component Value Date   CHOL 194 04/26/2020   HDL 72 04/26/2020   LDLCALC 114 (H) 04/26/2020   TRIG 43 04/26/2020   CHOLHDL 2.7 04/26/2020   Lab Results  Component Value Date   NA 141 04/26/2020   K 3.9 04/26/2020   CREATININE 0.89 04/26/2020   GFRNONAA 71 04/26/2020   GLUCOSE 91 04/26/2020     The 10-year ASCVD risk score (Arnett DK, et al., 2019) is: 6.8%  ---------------------------------------------------------------------------------------------------  Lipid/Cholesterol, Follow-up  Last lipid panel Other pertinent labs  Lab Results  Component Value Date   CHOL 194  04/26/2020   HDL 72 04/26/2020   LDLCALC 114 (H) 04/26/2020   TRIG 43 04/26/2020   CHOLHDL 2.7 04/26/2020   Lab Results  Component Value Date   ALT 10 04/26/2020   AST 18 04/26/2020   PLT 346 04/26/2020     She was last seen for this 4 months ago.  Management since that visit includes continue current medication pending lab results.  She reports excellent compliance with treatment. She is not having side effects. --she reports some stiffness in her hand, joints. Unsure if related to cholesterol medication  Symptoms: No chest pain No chest pressure/discomfort  No dyspnea No lower extremity edema  No numbness or tingling of extremity No orthopnea  No palpitations No paroxysmal nocturnal dyspnea  No speech difficulty No syncope   Current diet: low salt Current exercise: walking  The 10-year ASCVD risk score (Arnett DK, et al., 2019) is: 6.8%  ---------------------------------------------------------------------------------------------------   Medications: Outpatient Medications Prior to Visit  Medication Sig   Ascorbic Acid (VITAMIN C PO) Take by mouth.   triamcinolone (KENALOG) 0.025 % cream Apply 1 application topically 2 (two) times daily.   [DISCONTINUED] amLODipine (NORVASC) 10 MG tablet Take 1 tablet by mouth once daily   [DISCONTINUED] atorvastatin (LIPITOR) 40 MG tablet Take 1 tablet (40 mg total) by mouth daily. Please schedule office visit before any future refill   [DISCONTINUED] lisinopril (ZESTRIL) 20 MG tablet Take 1 tablet by mouth once daily   No facility-administered medications prior to visit.    Review  of Systems  Constitutional:  Negative for fatigue and fever.  Respiratory:  Negative for cough and shortness of breath.   Cardiovascular:  Negative for chest pain and leg swelling.  Gastrointestinal:  Negative for abdominal pain.  Neurological:  Negative for dizziness and headaches.       Objective    Blood pressure 129/88, pulse 75, temperature  97.8 F (36.6 C), temperature source Oral, height 5\' 3"  (1.6 m), weight 119 lb 12.8 oz (54.3 kg), SpO2 100 %.   Physical Exam Constitutional:      General: She is awake.     Appearance: She is well-developed.  HENT:     Head: Normocephalic.  Eyes:     Conjunctiva/sclera: Conjunctivae normal.  Cardiovascular:     Rate and Rhythm: Normal rate and regular rhythm.     Heart sounds: Normal heart sounds.  Pulmonary:     Effort: Pulmonary effort is normal.     Breath sounds: Normal breath sounds.  Musculoskeletal:     Right lower leg: No edema.     Left lower leg: No edema.  Skin:    General: Skin is warm.  Neurological:     Mental Status: She is alert and oriented to person, place, and time.  Psychiatric:        Attention and Perception: Attention normal.        Mood and Affect: Mood normal.        Speech: Speech normal.        Behavior: Behavior is cooperative.      No results found for any visits on 03/07/22.  Assessment & Plan     Problem List Items Addressed This Visit       Cardiovascular and Mediastinum   Hypertension - Primary    Initially elevated but on repeat in range.  Advised pt to check occasionally at home to get an average reading. F/u in 8 weeks       Relevant Medications   amLODipine (NORVASC) 10 MG tablet   lisinopril (ZESTRIL) 20 MG tablet   atorvastatin (LIPITOR) 40 MG tablet   Other Relevant Orders   CBC w/Diff/Platelet   Comprehensive Metabolic Panel (CMET)   AMB Referral to [facility] Coordinaton     Other   Hypercholesteremia    Will recheck fasting labs.  Based on levels can consider lower dose of atorvastatin.  Goal is LDL < 100  The 10-year ASCVD risk score (Arnett DK, et al., 2019) is: 6.8%       Relevant Medications   amLODipine (NORVASC) 10 MG tablet   lisinopril (ZESTRIL) 20 MG tablet   atorvastatin (LIPITOR) 40 MG tablet   Other Relevant Orders   Lipid Profile   CBC w/Diff/Platelet   Comprehensive Metabolic Panel  (CMET)   AMB Referral to [facility] Coordinaton   Other Visit Diagnoses     Encounter for screening mammogram for breast cancer       Relevant Orders   MM 3D SCREEN BREAST BILATERAL   Colon cancer screening       Relevant Orders   Cologuard      Referral to CCM as pt is uninsured and will not be insured until she reaches medicare age. I advised to speak with social work to see if she can apply for medicaid and she can complete her screenings.  If insured, then she can proceed w/ cologuard, mammogram, and pap.   Return in about 8 weeks (around 05/02/2022) for hypertension, hyperlipidemia.  I, Alfredia Ferguson, PA-C have reviewed all documentation for this visit. The documentation on  03/07/2022  for the exam, diagnosis, procedures, and orders are all accurate and complete.  Alfredia Ferguson, PA-C [facility] [address] [place], [place], [address] Office: [phone] Fax: [phone]   [facility]

## 2022-03-07 ENCOUNTER — Telehealth: Payer: Self-pay

## 2022-03-07 ENCOUNTER — Encounter: Payer: Self-pay | Admitting: Physician Assistant

## 2022-03-07 ENCOUNTER — Ambulatory Visit (INDEPENDENT_AMBULATORY_CARE_PROVIDER_SITE_OTHER): Payer: Self-pay | Admitting: Physician Assistant

## 2022-03-07 VITALS — BP 129/88 | HR 75 | Temp 97.8°F | Ht 63.0 in | Wt 119.8 lb

## 2022-03-07 DIAGNOSIS — Z1231 Encounter for screening mammogram for malignant neoplasm of breast: Secondary | ICD-10-CM

## 2022-03-07 DIAGNOSIS — Z1211 Encounter for screening for malignant neoplasm of colon: Secondary | ICD-10-CM

## 2022-03-07 DIAGNOSIS — I10 Essential (primary) hypertension: Secondary | ICD-10-CM

## 2022-03-07 DIAGNOSIS — E7800 Pure hypercholesterolemia, unspecified: Secondary | ICD-10-CM

## 2022-03-07 MED ORDER — ATORVASTATIN CALCIUM 40 MG PO TABS
40.0000 mg | ORAL_TABLET | Freq: Every day | ORAL | 1 refills | Status: DC
Start: 2022-03-07 — End: 2022-09-03

## 2022-03-07 MED ORDER — AMLODIPINE BESYLATE 10 MG PO TABS: 10.0000 mg | ORAL_TABLET | Freq: Every day | ORAL | 1 refills | Status: DC

## 2022-03-07 MED ORDER — LISINOPRIL 20 MG PO TABS: 20.0000 mg | ORAL_TABLET | Freq: Every day | ORAL | 1 refills | Status: DC

## 2022-03-07 NOTE — Assessment & Plan Note (Signed)
Initially elevated but on repeat in range.  Advised pt to check occasionally at home to get an average reading. F/u in 8 weeks

## 2022-03-07 NOTE — Assessment & Plan Note (Signed)
Will recheck fasting labs.  Based on levels can consider lower dose of atorvastatin.  Goal is LDL < 100  The 10-year ASCVD risk score (Arnett DK, et al., 2019) is: 6.8%

## 2022-03-07 NOTE — Telephone Encounter (Signed)
   Telephone encounter was:  Successful.  03/07/2022 Name: Jodi Martin MRN: [id number] DOB: 1961/07/04  Jodi Martin is a 61 y.o. year old female who is a primary care patient of Alfredia Ferguson, [place] . The community resource team was consulted for assistance with  insurance   Care guide performed the following interventions: Patient provided with information about care guide support team and interviewed to confirm resource needs.Mailing resouces to patient as requested for insurace information and options  Follow Up Plan:  Care guide will follow up with patient by phone over the next two weeks    [facility] Guide, Embedded Care Coordination [facility], Care Management  [phone] [address] [place], [place], [place] [address] Phone: [phone] Email: [email or web address]

## 2022-03-07 NOTE — Telephone Encounter (Signed)
Please disregard   Kendrell Lottman Care Guide, Embedded Care Coordination [place], Care Management  [phone] [address], [place], [place] [address] Phone: [phone] Email: [email or web address]    

## 2022-03-08 LAB — CBC WITH DIFFERENTIAL/PLATELET
Basophils Absolute: 0.1 10*3/uL (ref 0.0–0.2)
Basos: 2 %
EOS (ABSOLUTE): 0.2 10*3/uL (ref 0.0–0.4)
Eos: 5 %
Hematocrit: 40.7 % (ref 34.0–46.6)
Hemoglobin: 13.4 g/dL (ref 11.1–15.9)
Immature Grans (Abs): 0 10*3/uL (ref 0.0–0.1)
Immature Granulocytes: 0 %
Lymphocytes Absolute: 1.5 10*3/uL (ref 0.7–3.1)
Lymphs: 34 %
MCH: 28.6 pg (ref 26.6–33.0)
MCHC: 32.9 g/dL (ref 31.5–35.7)
MCV: 87 fL (ref 79–97)
Monocytes Absolute: 0.5 10*3/uL (ref 0.1–0.9)
Monocytes: 12 %
Neutrophils Absolute: 2.1 10*3/uL (ref 1.4–7.0)
Neutrophils: 47 %
Platelets: 341 10*3/uL (ref 150–450)
RBC: 4.68 x10E6/uL (ref 3.77–5.28)
RDW: 13.8 % (ref 11.7–15.4)
WBC: 4.4 10*3/uL (ref 3.4–10.8)

## 2022-03-08 LAB — COMPREHENSIVE METABOLIC PANEL
ALT: 14 IU/L (ref 0–32)
AST: 18 IU/L (ref 0–40)
Albumin/Globulin Ratio: 1.6 (ref 1.2–2.2)
Albumin: 4.9 g/dL — ABNORMAL HIGH (ref 3.8–4.8)
Alkaline Phosphatase: 106 IU/L (ref 44–121)
BUN/Creatinine Ratio: 10 — ABNORMAL LOW (ref 12–28)
BUN: 10 mg/dL (ref 8–27)
Bilirubin Total: 0.9 mg/dL (ref 0.0–1.2)
CO2: 22 mmol/L (ref 20–29)
Calcium: 10 mg/dL (ref 8.7–10.3)
Chloride: 102 mmol/L (ref 96–106)
Creatinine, Ser: 0.96 mg/dL (ref 0.57–1.00)
Globulin, Total: 3.1 g/dL (ref 1.5–4.5)
Glucose: 96 mg/dL (ref 70–99)
Potassium: 4.6 mmol/L (ref 3.5–5.2)
Sodium: 142 mmol/L (ref 134–144)
Total Protein: 8 g/dL (ref 6.0–8.5)
eGFR: 67 mL/min/{1.73_m2} (ref >59)

## 2022-03-08 LAB — LIPID PANEL
Chol/HDL Ratio: 2.4 ratio (ref 0.0–4.4)
Cholesterol, Total: 189 mg/dL (ref 100–199)
HDL: 79 mg/dL (ref >39)
LDL Chol Calc (NIH): 99 mg/dL (ref 0–99)
Triglycerides: 56 mg/dL (ref 0–149)
VLDL Cholesterol Cal: 11 mg/dL (ref 5–40)

## 2022-03-10 ENCOUNTER — Other Ambulatory Visit: Payer: Self-pay | Admitting: Physician Assistant

## 2022-03-10 DIAGNOSIS — Z1231 Encounter for screening mammogram for malignant neoplasm of breast: Secondary | ICD-10-CM

## 2022-03-10 DIAGNOSIS — N63 Unspecified lump in unspecified breast: Secondary | ICD-10-CM

## 2022-03-26 ENCOUNTER — Telehealth: Payer: Self-pay

## 2022-03-26 NOTE — Telephone Encounter (Signed)
   Telephone encounter was:  Unsuccessful.  03/26/2022 Name: Genice Kimberlin MRN: [id number] DOB: [birth year]  Unsuccessful outbound call made today to assist with:   insurance Follow up call. Left a message to call back at earliest convenience.      Lenard Forth Care Guide, Embedded Care Coordination [facility], Care Management  [phone] [address] [place], [place], [place] [address] Phone: [phone] Email: [email or web address]

## 2022-03-28 ENCOUNTER — Telehealth: Payer: Self-pay

## 2022-03-28 NOTE — Telephone Encounter (Signed)
   Telephone encounter was:  Unsuccessful.  03/28/2022 Name: Hamna Asa MRN: [id number] DOB: [birth year]  Unsuccessful outbound call made today to assist with:   insurance  Outreach Attempt:  3rd Attempt.  Referral closed unable to contact patient.  A HIPAA compliant voice message was left requesting a return call.  Instructed patient to call back at earliest convenience.Lenard Forth Care Guide, Embedded Care Coordination [facility], Care Management  [phone] [address] [place], [place], [place] [address] Phone: [phone] Email: [email or web address]

## 2022-04-02 ENCOUNTER — Ambulatory Visit
Admission: RE | Admit: 2022-04-02 | Discharge: 2022-04-02 | Disposition: A | Discharge status: Home / Self Care | Payer: Self-pay | Source: Ambulatory Visit | Attending: Physician Assistant | Admitting: Physician Assistant

## 2022-04-02 DIAGNOSIS — N63 Unspecified lump in unspecified breast: Secondary | ICD-10-CM

## 2022-04-02 DIAGNOSIS — Z1231 Encounter for screening mammogram for malignant neoplasm of breast: Secondary | ICD-10-CM | POA: Insufficient documentation

## 2022-05-02 ENCOUNTER — Ambulatory Visit: Payer: Self-pay | Admitting: Physician Assistant

## 2022-05-23 ENCOUNTER — Ambulatory Visit: Payer: Self-pay | Admitting: Physician Assistant

## 2022-06-27 ENCOUNTER — Ambulatory Visit (INDEPENDENT_AMBULATORY_CARE_PROVIDER_SITE_OTHER): Payer: Self-pay | Admitting: Physician Assistant

## 2022-06-27 ENCOUNTER — Encounter: Payer: Self-pay | Admitting: Physician Assistant

## 2022-06-27 DIAGNOSIS — I10 Essential (primary) hypertension: Secondary | ICD-10-CM

## 2022-06-27 NOTE — Progress Notes (Signed)
I,Sha'taria Tyson,acting as a Education administrator for [employer], PA-C.,have documented all relevant documentation on the behalf of Jodi Kirschner, PA-C,as directed by  Jodi Kirschner, PA-C while in the presence of Jodi Kirschner, PA-C.   Established patient visit   Patient: Jodi Martin   DOB: 14-Jan-1961   61 y.o. Female  MRN: [id number] Visit Date: 06/27/2022  Today's healthcare provider: [name], PA-C   Cc. Htn f/u  Subjective    HPI  Hypertension, follow-up  BP Readings from Last 3 Encounters:  06/27/22 134/77  03/07/22 129/88  10/11/21 135/83   Wt Readings from Last 3 Encounters:  06/27/22 118 lb 1.6 oz (53.6 kg)  03/07/22 119 lb 12.8 oz (54.3 kg)  10/11/21 118 lb (53.5 kg)     She was last seen for hypertension 4 months ago.  BP at that visit was 129/88. Management since that visit includes advised pt to check occasionally at home to get an average reading. F/u in 8 weeks   She reports excellent compliance with treatment. She is not having side effects.  She is following a Low Sodium diet. She is not exercising. She does not smoke.  Use of agents associated with hypertension: none.   Outside blood pressures are not being checked as cuff is malfunctioning  Symptoms: No chest pain No chest pressure  No palpitations No syncope  No dyspnea No orthopnea  No paroxysmal nocturnal dyspnea No lower extremity edema   Pertinent labs Lab Results  Component Value Date   CHOL 189 03/07/2022   HDL 79 03/07/2022   LDLCALC 99 03/07/2022   TRIG 56 03/07/2022   CHOLHDL 2.4 03/07/2022   Lab Results  Component Value Date   NA 142 03/07/2022   K 4.6 03/07/2022   CREATININE 0.96 03/07/2022   EGFR 67 03/07/2022   GLUCOSE 96 03/07/2022     The 10-year ASCVD risk score (Arnett DK, et al., 2019) is: 7%  ---------------------------------------------------------------------------------------------------   Medications: Outpatient Medications Prior to Visit   Medication Sig   amLODipine (NORVASC) 10 MG tablet Take 1 tablet (10 mg total) by mouth daily.   Ascorbic Acid (VITAMIN C PO) Take by mouth.   atorvastatin (LIPITOR) 40 MG tablet Take 1 tablet (40 mg total) by mouth daily.   lisinopril (ZESTRIL) 20 MG tablet Take 1 tablet (20 mg total) by mouth daily.   triamcinolone (KENALOG) 0.025 % cream Apply 1 application topically 2 (two) times daily.   No facility-administered medications prior to visit.       Objective    Blood pressure 134/77, pulse 81, weight 118 lb 1.6 oz (53.6 kg), SpO2 99 %.   Physical Exam Constitutional:      General: She is awake.     Appearance: She is well-developed.  HENT:     Head: Normocephalic.  Eyes:     Conjunctiva/sclera: Conjunctivae normal.  Cardiovascular:     Rate and Rhythm: Normal rate and regular rhythm.     Heart sounds: Normal heart sounds.  Pulmonary:     Effort: Pulmonary effort is normal.     Breath sounds: Normal breath sounds.  Musculoskeletal:     Right lower leg: No edema.     Left lower leg: No edema.  Skin:    General: Skin is warm.  Neurological:     Mental Status: She is alert and oriented to person, place, and time.  Psychiatric:        Attention and Perception: Attention normal.  Mood and Affect: Mood normal.        Speech: Speech normal.        Behavior: Behavior is cooperative.     No results found for any visits on 06/27/22.  Assessment & Plan     Problem List Items Addressed This Visit       Cardiovascular and Mediastinum   Hypertension    Compared home meter to office-- pt will return home meter as it is inaccurate Encourage to continue to check at home Chronic, now well controlled Continue current medications F/u 6 mo      Please note pt is uninsured and would like to defer all screenings until she has insurance  Return in about 6 months (around 12/27/2022) for CPE.      I, Jodi Kirschner, PA-C have reviewed all documentation for this visit.  The documentation on  06/27/2022  for the exam, diagnosis, procedures, and orders are all accurate and complete.  Jodi Kirschner, PA-C [facility] [address] [place], [place], [address] Office: [phone] Fax: [place]

## 2022-06-27 NOTE — Assessment & Plan Note (Signed)
Compared home meter to office-- pt will return home meter as it is inaccurate Encourage to continue to check at home Chronic, now well controlled Continue current medications F/u 6 mo

## 2022-07-01 ENCOUNTER — Other Ambulatory Visit: Payer: Self-pay | Admitting: Physician Assistant

## 2022-07-01 DIAGNOSIS — I10 Essential (primary) hypertension: Secondary | ICD-10-CM

## 2022-07-01 DIAGNOSIS — E7800 Pure hypercholesterolemia, unspecified: Secondary | ICD-10-CM

## 2022-07-04 ENCOUNTER — Telehealth: Payer: Self-pay

## 2022-07-04 NOTE — Chronic Care Management (AMB) (Signed)
  Care Coordination   Note   07/04/2022 Name: Jodi Martin MRN: [id number] DOB: [birth year]  Jodi Martin is a 61 y.o. year old female who sees Thedore Mins, Ria Comment, [place] for primary care. I reached out to Brynda Greathouse by phone today to offer care coordination services.  Ms. Covin was given information about Care Coordination services today including:   The Care Coordination services include support from the care team which includes your Nurse Coordinator, Clinical Social Worker, or Pharmacist.  The Care Coordination team is here to help remove barriers to the health concerns and goals most important to you. Care Coordination services are voluntary, and the patient may decline or stop services at any time by request to their care team member.   Care Coordination Consent Status: Patient agreed to services and verbal consent obtained.   Follow up plan:  Telephone appointment with care coordination team member scheduled for:  07/10/2022  Encounter Outcome:  Pt. Scheduled  Noreene Larsson, [place], [place] [address] Direct Dial: [phone] [email or web address]

## 2022-07-10 ENCOUNTER — Ambulatory Visit: Payer: Self-pay | Admitting: *Deleted

## 2022-07-10 NOTE — Patient Outreach (Signed)
  Care Coordination   Initial Visit Note   07/10/2022 Name: Chynah Orihuela MRN: [id number] DOB: Jan 10, 1961  Samona Chihuahua is a 61 y.o. year old female who sees Thedore Mins, Ria Comment, [place] for primary care. I spoke with  Brynda Greathouse by phone today.  What matters to the patients health and wellness today?  Insurance options    Goals Addressed             This Visit's Progress    Insurance questions       Care Coordination Interventions: Patient discussed being uninsured at this time and wanted to discuss options This Education officer, museum discussed applying for insurance through [employer]-patient discussed a previous plan of applying for coverage through this platform This Education officer, museum further discussed options for the [facility]-patient will research this as an option as well for general care and pharmacy          SDOH assessments and interventions completed:  Yes  SDOH Interventions Today    Flowsheet Row Most Recent Value  SDOH Interventions   Food Insecurity Interventions Intervention Not Indicated  Housing Interventions Intervention Not Indicated  Transportation Interventions Intervention Not Indicated  Financial Strain Interventions Intervention Not Indicated        Care Coordination Interventions Activated:  Yes  Care Coordination Interventions:  Yes, provided   Follow up plan: No further intervention required.   Encounter Outcome:  Pt. Visit Completed

## 2022-07-10 NOTE — Patient Instructions (Signed)
Visit Information  Thank you for taking time to visit with me today. Please don't hesitate to contact me if I can be of assistance to you.   Following are the goals we discussed today:   Goals Addressed             This Visit's Progress    Insurance questions       Care Coordination Interventions: Patient discussed being uninsured at this time and wanted to discuss options This Education officer, museum discussed applying for insurance through [employer]-patient discussed a previous plan of applying for coverage through this platform This Education officer, museum further discussed options for the [facility]-patient will research this as an option as well for general care and pharmacy          If you are experiencing a Mental Health or [place] or need someone to talk to, please call the Suicide and Crisis Lifeline: 988 call 911   Patient verbalizes understanding of instructions and care plan provided today and agrees to view in [place]. Active MyChart status and patient understanding of how to access instructions and care plan via MyChart confirmed with patient.     No further follow up required: patient to call this Education officer, museum with any additional community resource needs  [employer], [place] Worker  [facility] Care Management [phone]

## 2022-09-03 ENCOUNTER — Other Ambulatory Visit: Payer: Self-pay | Admitting: Physician Assistant

## 2022-09-03 DIAGNOSIS — E7800 Pure hypercholesterolemia, unspecified: Secondary | ICD-10-CM

## 2022-09-03 DIAGNOSIS — I10 Essential (primary) hypertension: Secondary | ICD-10-CM

## 2022-12-01 ENCOUNTER — Other Ambulatory Visit: Payer: Self-pay | Admitting: Family Medicine

## 2022-12-01 DIAGNOSIS — I10 Essential (primary) hypertension: Secondary | ICD-10-CM

## 2022-12-06 ENCOUNTER — Other Ambulatory Visit: Payer: Self-pay | Admitting: Family Medicine

## 2022-12-06 DIAGNOSIS — I10 Essential (primary) hypertension: Secondary | ICD-10-CM

## 2022-12-09 ENCOUNTER — Other Ambulatory Visit: Payer: Self-pay | Admitting: Physician Assistant

## 2022-12-09 DIAGNOSIS — I10 Essential (primary) hypertension: Secondary | ICD-10-CM
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
## 2022-12-18 NOTE — Progress Notes (Signed)
I,J'ya E Hunter,acting as a scribe for [employer], PA-C.,have documented all relevant documentation on the behalf of Alfredia Ferguson, PA-C,as directed by  Alfredia Ferguson, PA-C while in the presence of Alfredia Ferguson, PA-C.   Established patient visit   Patient: Jodi Martin   DOB: February 08, 1961   62 y.o. Female  MRN: [id number] Visit Date: 12/19/2022  Today's healthcare provider: Alfredia Ferguson, PA-C   Chief Complaint  Patient presents with   Hypertension   Subjective    HPI  Hypertension: Patient here for follow-up of elevated blood pressure. She is  not exercising and is adherent to low salt diet.  Blood pressure is not well controlled at home. Cardiac symptoms none. Patient denies sob, chest pain, leg swelling.  Medications: Outpatient Medications Prior to Visit  Medication Sig   amLODipine (NORVASC) 10 MG tablet Take 1 tablet by mouth once daily   Ascorbic Acid (VITAMIN C PO) Take by mouth.   atorvastatin (LIPITOR) 40 MG tablet Take 1 tablet by mouth once daily   lisinopril (ZESTRIL) 20 MG tablet Take 1 tablet by mouth once daily   [DISCONTINUED] triamcinolone (KENALOG) 0.025 % cream Apply 1 application topically 2 (two) times daily. (Patient not taking: Reported on 12/19/2022)   No facility-administered medications prior to visit.    Review of Systems  Constitutional:  Negative for fatigue and fever.  Respiratory:  Negative for cough and shortness of breath.   Cardiovascular:  Negative for chest pain and leg swelling.  Gastrointestinal:  Negative for abdominal pain.  Neurological:  Negative for dizziness and headaches.      Objective    BP 125/80 (BP Location: Right Arm, Patient Position: Sitting, Cuff Size: Normal)   Pulse 86   Temp 98.6 F (37 C) (Oral)   Ht  (1.575 m)   Wt 118 lb 1.6 oz (53.6 kg)   SpO2 100%   BMI 21.60 kg/m    Physical Exam Constitutional:      General: She is awake.     Appearance: She is well-developed.  HENT:      Head: Normocephalic.  Eyes:     Conjunctiva/sclera: Conjunctivae normal.  Cardiovascular:     Rate and Rhythm: Normal rate and regular rhythm.     Heart sounds: Normal heart sounds.  Pulmonary:     Effort: Pulmonary effort is normal.     Breath sounds: Normal breath sounds.  Musculoskeletal:     Right lower leg: No edema.     Left lower leg: No edema.  Skin:    General: Skin is warm.  Neurological:     Mental Status: She is alert and oriented to person, place, and time.  Psychiatric:        Attention and Perception: Attention normal.        Mood and Affect: Mood normal.        Speech: Speech normal.        Behavior: Behavior is cooperative.      No results found for any visits on 12/19/22.  Assessment & Plan     Problem List Items Addressed This Visit       Cardiovascular and Mediastinum   Hypertension - Primary    Well controlled Continue amlodipine 10 mg, lisinopril 20 mg.  Ordered cmp -- to be done 6/24 F/u 6 mo       Relevant Orders   CBC w/Diff/Platelet   Comprehensive Metabolic Panel (CMET)     Other   Hypercholesteremia  Managed with atorvastatin 40 mg  Repeat fasting labs Goal LDL < 100      Relevant Orders   Comprehensive Metabolic Panel (CMET)   Lipid Profile    Pt declines all health screenings until she is insured.  Return in about 6 months (around 06/20/2023) for CPE.      I, Alfredia Ferguson, PA-C have reviewed all documentation for this visit. The documentation on  12/19/22  for the exam, diagnosis, procedures, and orders are all accurate and complete.  Alfredia Ferguson, PA-C [facility] [address] [place], [place], [address] Office: [phone] Fax: [phone]   [facility]

## 2022-12-19 ENCOUNTER — Ambulatory Visit: Payer: Self-pay | Admitting: Physician Assistant

## 2022-12-19 ENCOUNTER — Encounter: Payer: Self-pay | Admitting: Physician Assistant

## 2022-12-19 VITALS — BP 125/80 | HR 86 | Temp 98.6°F | Ht 62.0 in | Wt 118.1 lb

## 2022-12-19 DIAGNOSIS — E7800 Pure hypercholesterolemia, unspecified: Secondary | ICD-10-CM

## 2022-12-19 DIAGNOSIS — I10 Essential (primary) hypertension: Secondary | ICD-10-CM

## 2022-12-19 NOTE — Assessment & Plan Note (Signed)
Managed with atorvastatin 40 mg  Repeat fasting labs Goal LDL < 100

## 2022-12-19 NOTE — Assessment & Plan Note (Signed)
Well controlled Continue amlodipine 10 mg, lisinopril 20 mg.  Ordered cmp -- to be done 6/24 F/u 6 mo

## 2023-01-05 ENCOUNTER — Other Ambulatory Visit: Payer: Self-pay | Admitting: Family Medicine

## 2023-01-05 DIAGNOSIS — E7800 Pure hypercholesterolemia, unspecified: Secondary | ICD-10-CM

## 2023-01-14 ENCOUNTER — Other Ambulatory Visit: Payer: Self-pay | Admitting: Family Medicine

## 2023-01-14 DIAGNOSIS — E7800 Pure hypercholesterolemia, unspecified: Secondary | ICD-10-CM
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
## 2023-02-21 LAB — COMPREHENSIVE METABOLIC PANEL
ALT: 13 IU/L (ref 0–32)
AST: 18 IU/L (ref 0–40)
Albumin: 4.4 g/dL (ref 3.9–4.9)
Alkaline Phosphatase: 90 IU/L (ref 44–121)
BUN/Creatinine Ratio: 14 (ref 12–28)
BUN: 14 mg/dL (ref 8–27)
Bilirubin Total: 0.9 mg/dL (ref 0.0–1.2)
CO2: 24 mmol/L (ref 20–29)
Calcium: 9.5 mg/dL (ref 8.7–10.3)
Chloride: 103 mmol/L (ref 96–106)
Creatinine, Ser: 1.01 mg/dL — ABNORMAL HIGH (ref 0.57–1.00)
Globulin, Total: 3 g/dL (ref 1.5–4.5)
Glucose: 139 mg/dL — ABNORMAL HIGH (ref 70–99)
Potassium: 4 mmol/L (ref 3.5–5.2)
Sodium: 140 mmol/L (ref 134–144)
Total Protein: 7.4 g/dL (ref 6.0–8.5)
eGFR: 63 mL/min/{1.73_m2} (ref >59)

## 2023-02-21 LAB — LIPID PANEL
Chol/HDL Ratio: 2.5 ratio (ref 0.0–4.4)
Cholesterol, Total: 181 mg/dL (ref 100–199)
HDL: 73 mg/dL (ref >39)
LDL Chol Calc (NIH): 97 mg/dL (ref 0–99)
Triglycerides: 55 mg/dL (ref 0–149)
VLDL Cholesterol Cal: 11 mg/dL (ref 5–40)

## 2023-02-21 LAB — CBC WITH DIFFERENTIAL/PLATELET
Basophils Absolute: 0.1 10*3/uL (ref 0.0–0.2)
Basos: 2 %
EOS (ABSOLUTE): 0.2 10*3/uL (ref 0.0–0.4)
Eos: 3 %
Hematocrit: 38.8 % (ref 34.0–46.6)
Hemoglobin: 12.6 g/dL (ref 11.1–15.9)
Immature Grans (Abs): 0 10*3/uL (ref 0.0–0.1)
Immature Granulocytes: 0 %
Lymphocytes Absolute: 1.8 10*3/uL (ref 0.7–3.1)
Lymphs: 34 %
MCH: 29.4 pg (ref 26.6–33.0)
MCHC: 32.5 g/dL (ref 31.5–35.7)
MCV: 91 fL (ref 79–97)
Monocytes Absolute: 0.4 10*3/uL (ref 0.1–0.9)
Monocytes: 8 %
Neutrophils Absolute: 2.8 10*3/uL (ref 1.4–7.0)
Neutrophils: 53 %
Platelets: 327 10*3/uL (ref 150–450)
RBC: 4.28 x10E6/uL (ref 3.77–5.28)
RDW: 13.8 % (ref 11.7–15.4)
WBC: 5.2 10*3/uL (ref 3.4–10.8)

## 2023-02-26 LAB — HGB A1C W/O EAG: Hgb A1c MFr Bld: 6.1 % — ABNORMAL HIGH (ref 4.8–5.6)

## 2023-02-26 LAB — SPECIMEN STATUS REPORT

## 2023-03-04 ENCOUNTER — Other Ambulatory Visit: Payer: Self-pay | Admitting: Family Medicine

## 2023-03-04 DIAGNOSIS — I10 Essential (primary) hypertension: Secondary | ICD-10-CM

## 2023-03-05 ENCOUNTER — Other Ambulatory Visit: Payer: Self-pay | Admitting: Family Medicine

## 2023-03-05 ENCOUNTER — Other Ambulatory Visit: Payer: Self-pay | Admitting: Physician Assistant

## 2023-03-05 DIAGNOSIS — I10 Essential (primary) hypertension: Secondary | ICD-10-CM
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
## 2023-04-13 ENCOUNTER — Other Ambulatory Visit: Payer: Self-pay | Admitting: Physician Assistant

## 2023-04-13 DIAGNOSIS — E7800 Pure hypercholesterolemia, unspecified: Secondary | ICD-10-CM

## 2023-04-15 ENCOUNTER — Other Ambulatory Visit: Payer: Self-pay | Admitting: Physician Assistant

## 2023-04-15 DIAGNOSIS — E7800 Pure hypercholesterolemia, unspecified: Secondary | ICD-10-CM

## 2023-05-18 ENCOUNTER — Other Ambulatory Visit: Payer: Self-pay | Admitting: Family Medicine

## 2023-05-18 DIAGNOSIS — E7800 Pure hypercholesterolemia, unspecified: Secondary | ICD-10-CM
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
## 2023-05-29 ENCOUNTER — Ambulatory Visit: Payer: Self-pay | Admitting: Family Medicine

## 2023-06-05 ENCOUNTER — Other Ambulatory Visit: Payer: Self-pay | Admitting: Family Medicine

## 2023-06-05 DIAGNOSIS — I10 Essential (primary) hypertension: Secondary | ICD-10-CM
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
## 2023-06-05 NOTE — Telephone Encounter (Signed)
Called pt and made a follow up appt.

## 2023-06-11 NOTE — Progress Notes (Deleted)
      Established patient visit   Patient: Jodi Martin   DOB: Sep 07, 1961   62 y.o. Female  MRN: [id number] Visit Date: 06/12/2023  Today's healthcare provider: Ronnald Ramp, MD   No chief complaint on file.  Subjective       Discussed the use of AI scribe software for clinical note transcription with the patient, who gave verbal consent to proceed.  History of Present Illness             Past Medical History:  Diagnosis Date  . Hypercholesteremia   . Hypertension     Medications: Outpatient Medications Prior to Visit  Medication Sig  . amLODipine (NORVASC) 10 MG tablet Take 1 tablet by mouth once daily  . Ascorbic Acid (VITAMIN C PO) Take by mouth.  [place] atorvastatin (LIPITOR) 40 MG tablet Take 1 tablet by mouth once daily  . lisinopril (ZESTRIL) 20 MG tablet Take 1 tablet by mouth once daily   No facility-administered medications prior to visit.    Review of Systems  Last metabolic panel Lab Results  Component Value Date   GLUCOSE 139 (H) 02/20/2023   NA 140 02/20/2023   K 4.0 02/20/2023   CL 103 02/20/2023   CO2 24 02/20/2023   BUN 14 02/20/2023   CREATININE 1.01 (H) 02/20/2023   EGFR 63 02/20/2023   CALCIUM 9.5 02/20/2023   PROT 7.4 02/20/2023   ALBUMIN 4.4 02/20/2023   LABGLOB 3.0 02/20/2023   AGRATIO 1.6 03/07/2022   BILITOT 0.9 02/20/2023   ALKPHOS 90 02/20/2023   AST 18 02/20/2023   ALT 13 02/20/2023   Last lipids Lab Results  Component Value Date   CHOL 181 02/20/2023   HDL 73 02/20/2023   LDLCALC 97 02/20/2023   TRIG 55 02/20/2023   CHOLHDL 2.5 02/20/2023   Last hemoglobin A1c Lab Results  Component Value Date   HGBA1C 6.1 (H) 02/20/2023     {See past labs  Heme  Chem  Endocrine  Serology  Results Review (optional):1}   Objective    There were no vitals taken for this visit. BP Readings from Last 3 Encounters:  12/19/22 125/80  06/27/22 134/77  03/07/22 129/88   Wt Readings from Last 3  Encounters:  12/19/22 118 lb 1.6 oz (53.6 kg)  06/27/22 118 lb 1.6 oz (53.6 kg)  03/07/22 119 lb 12.8 oz (54.3 kg)    {See vitals history (optional):1}   Physical Exam  ***  No results found for any visits on 06/12/23.  Assessment & Plan     Problem List Items Addressed This Visit   None   Assessment and Plan              No follow-ups on file.         Ronnald Ramp, MD  [facility] [phone] (phone) [phone] (fax)  [facility]

## 2023-06-12 ENCOUNTER — Ambulatory Visit: Payer: Self-pay | Admitting: Family Medicine

## 2023-06-12 DIAGNOSIS — E7800 Pure hypercholesterolemia, unspecified: Secondary | ICD-10-CM

## 2023-06-12 DIAGNOSIS — I10 Essential (primary) hypertension: Secondary | ICD-10-CM

## 2023-06-18 ENCOUNTER — Other Ambulatory Visit: Payer: Self-pay | Admitting: Family Medicine

## 2023-06-18 DIAGNOSIS — E7800 Pure hypercholesterolemia, unspecified: Secondary | ICD-10-CM

## 2023-06-18 NOTE — Telephone Encounter (Signed)
Called patient to schedule appt for medication refills and TOC to another provider. Appt scheduled for 06/26/23.
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
## 2023-06-26 ENCOUNTER — Ambulatory Visit: Payer: Self-pay | Admitting: Family Medicine

## 2023-06-26 ENCOUNTER — Encounter: Payer: Self-pay | Admitting: Family Medicine

## 2023-06-26 VITALS — BP 110/80 | HR 75 | Temp 97.6°F | Ht 62.0 in | Wt 121.1 lb

## 2023-06-26 DIAGNOSIS — E7800 Pure hypercholesterolemia, unspecified: Secondary | ICD-10-CM

## 2023-06-26 DIAGNOSIS — I10 Essential (primary) hypertension: Secondary | ICD-10-CM

## 2023-06-26 MED ORDER — ATORVASTATIN CALCIUM 40 MG PO TABS: 40.0000 mg | ORAL_TABLET | Freq: Every day | ORAL | 3 refills | Status: DC

## 2023-06-26 MED ORDER — AMLODIPINE BESYLATE 10 MG PO TABS: 10.0000 mg | ORAL_TABLET | Freq: Every day | ORAL | 3 refills | Status: DC

## 2023-06-26 MED ORDER — LISINOPRIL 20 MG PO TABS: 20.0000 mg | ORAL_TABLET | Freq: Every day | ORAL | 3 refills | Status: DC

## 2023-06-26 NOTE — Progress Notes (Signed)
Established patient visit   Patient: Jodi Martin   DOB: 06-22-1961   62 y.o. Female  MRN: [id number]  Visit Date: 06/26/2023  Today's healthcare provider: Ronnald Ramp, MD   Chief Complaint  Patient presents with   Follow-up    Follow up ad medication refills   Subjective     HPI     Follow-up    Additional comments: Follow up ad medication refills      Last edited by Derinda Late A on 06/26/2023  2:27 PM.       Discussed the use of AI scribe software for clinical note transcription with the patient, who gave verbal consent to proceed.  History of Present Illness   Mrs. Jodi Martin, a patient with a history of hypertension and hyperlipidemia, presents for a routine medication management visit. The patient reports feeling well, with no complaints of dizziness, lightheadedness, or headaches. She is currently on Amlodipine 10mg  and Lisinopril 20mg  daily for blood pressure management, and Lipitor 40mg  for cholesterol control. The patient has not been monitoring her blood pressure at home due to a faulty blood pressure monitor, which she has since returned.  The patient's cholesterol levels were within the goal range during the last check in June, with an LDL of 97. The metabolic panel from the same period showed normal kidney, liver, and electrolyte levels. The patient also takes Vitamin C as a supplement.  The patient has been adhering to annual fit testing for colon cancer screening. However, she has deferred other screening tests, including Pap smear, mammogram, and shingles vaccine, due to pending changes in her insurance. The patient plans to proceed with these screenings once the insurance situation is resolved.         Past Medical History:  Diagnosis Date   Hypercholesteremia    Hypertension     Medications: Outpatient Medications Prior to Visit  Medication Sig   Ascorbic Acid (VITAMIN C PO) Take by mouth.   [DISCONTINUED]  amLODipine (NORVASC) 10 MG tablet Take 1 tablet by mouth once daily   [DISCONTINUED] atorvastatin (LIPITOR) 40 MG tablet Take 1 tablet by mouth once daily   [DISCONTINUED] lisinopril (ZESTRIL) 20 MG tablet Take 1 tablet by mouth once daily   No facility-administered medications prior to visit.    Review of Systems  Last metabolic panel Lab Results  Component Value Date   GLUCOSE 139 (H) 02/20/2023   NA 140 02/20/2023   K 4.0 02/20/2023   CL 103 02/20/2023   CO2 24 02/20/2023   BUN 14 02/20/2023   CREATININE 1.01 (H) 02/20/2023   EGFR 63 02/20/2023   CALCIUM 9.5 02/20/2023   PROT 7.4 02/20/2023   ALBUMIN 4.4 02/20/2023   LABGLOB 3.0 02/20/2023   AGRATIO 1.6 03/07/2022   BILITOT 0.9 02/20/2023   ALKPHOS 90 02/20/2023   AST 18 02/20/2023   ALT 13 02/20/2023   Last lipids Lab Results  Component Value Date   CHOL 181 02/20/2023   HDL 73 02/20/2023   LDLCALC 97 02/20/2023   TRIG 55 02/20/2023   CHOLHDL 2.5 02/20/2023        Objective    BP 110/80 (BP Location: Left Arm, Patient Position: Sitting, Cuff Size: Normal)   Pulse 75   Temp 97.6 F (36.4 C) (Oral)   Ht 5\' 2"  (1.575 m)   Wt 121 lb 1.6 oz (54.9 kg)   SpO2 99%   BMI 22.15 kg/m   BP Readings from Last 3 Encounters:  06/26/23  110/80  12/19/22 125/80  06/27/22 134/77   Wt Readings from Last 3 Encounters:  06/26/23 121 lb 1.6 oz (54.9 kg)  12/19/22 118 lb 1.6 oz (53.6 kg)  06/27/22 118 lb 1.6 oz (53.6 kg)       Physical Exam  Physical Exam   CHEST: Lungs clear to auscultation, no wheezing, no crackles. CARDIOVASCULAR: Heart sounds normal. EXTREMITIES: No swelling in legs.       No results found for any visits on 06/26/23.  Assessment & Plan     Problem List Items Addressed This Visit     Hypercholesteremia   Relevant Medications   atorvastatin (LIPITOR) 40 MG tablet   amLODipine (NORVASC) 10 MG tablet   lisinopril (ZESTRIL) 20 MG tablet   Hypertension - Primary   Relevant Medications    atorvastatin (LIPITOR) 40 MG tablet   amLODipine (NORVASC) 10 MG tablet   lisinopril (ZESTRIL) 20 MG tablet      Hypertension Well controlled on Amlodipine 10mg  and Lisinopril 20mg  daily. No reported side effects. -Chronic  -normal CMP in June 2024  -Continue current medications. -Refill prescriptions for 90 days with refills for a year.  Hyperlipidemia Well controlled on Lipitor 40mg  daily. Last LDL was 97 in June 2024 Chronic Recommend lipid panel at 6 mon follow up  -Continue Lipitor 40mg  daily. -Refill prescription for 90 days with refills for a year.  General Health Maintenance Patient deferring preventative screenings due to insurance issues. -Discussed need for Pap smear, mammogram, colon cancer screening, and shingles vaccine when insurance allows. -Plan to follow up in 6 months or sooner if needed.      Return in about 6 months (around 12/25/2023) for HTN, Cholesterol.      Ronnald Ramp, MD  [facility] [phone] (phone) [phone] (fax)  [facility]

## 2023-12-25 ENCOUNTER — Encounter: Payer: Self-pay | Admitting: Family Medicine

## 2023-12-25 ENCOUNTER — Ambulatory Visit: Payer: Self-pay | Admitting: Family Medicine

## 2023-12-25 VITALS — BP 86/60 | HR 76 | Ht 62.0 in | Wt 119.2 lb

## 2023-12-25 DIAGNOSIS — Z1211 Encounter for screening for malignant neoplasm of colon: Secondary | ICD-10-CM

## 2023-12-25 DIAGNOSIS — Z1231 Encounter for screening mammogram for malignant neoplasm of breast: Secondary | ICD-10-CM

## 2023-12-25 DIAGNOSIS — I10 Essential (primary) hypertension: Secondary | ICD-10-CM | POA: Diagnosis not present

## 2023-12-25 DIAGNOSIS — R7989 Other specified abnormal findings of blood chemistry: Secondary | ICD-10-CM

## 2023-12-25 DIAGNOSIS — E7800 Pure hypercholesterolemia, unspecified: Secondary | ICD-10-CM

## 2023-12-25 MED ORDER — AMLODIPINE BESYLATE 5 MG PO TABS: 5.0000 mg | ORAL_TABLET | Freq: Every day | ORAL | 1 refills | Status: DC

## 2023-12-25 NOTE — Assessment & Plan Note (Signed)
 Chronic hyperlipidemia managed with atorvastatin . Previous cholesterol levels were within a good range. Plan to reassess current cholesterol levels with a lipid panel. - Continue atorvastatin  40 mg daily - Order lipid panel to assess current cholesterol levels

## 2023-12-25 NOTE — Assessment & Plan Note (Signed)
 Chronic  Hypertension is well-controlled with current medication regimen. Blood pressure reading was 86/60 mmHg, indicating potential overmedication. No symptoms of hypotension reported. Discussed the possibility of reducing medication to prevent overmedication and dehydration, especially in hot weather. - Reduce amlodipine  to 5 mg daily and monitor blood pressure at home - Instruct Jodi Martin to record blood pressure readings and review at next visit - Send prescription for 5 mg amlodipine  tablets to [facility] pharmacy - continue lisinopril  20mg  daily  - CMP ordered today

## 2023-12-25 NOTE — Progress Notes (Signed)
 Established patient visit   Patient: Jodi Martin   DOB: 06-27-1961   63 y.o. Female  MRN: [id number] Visit Date: 12/25/2023  Today's healthcare provider: Rockie Agent, MD   No chief complaint on file.  Subjective       Discussed the use of AI scribe software for clinical note transcription with the patient, who gave verbal consent to proceed.  History of Present Illness Jodi Martin is a 63 year old female with hypertension and hyperlipidemia who presents for a follow-up visit.  She is currently on amlodipine  10 mg daily and lisinopril  20 mg daily for hypertension management. Her blood pressure is well-controlled, but she experiences lower energy levels when exposed to the sun for extended periods, which she attributes to dehydration. She avoids direct sunlight. No history of diabetes.  For hyperlipidemia, she takes atorvastatin  40 mg daily. Her last cholesterol check showed good control, and she is due for an updated lipid panel. She also takes a vitamin C supplement regularly.  No chest pain or breathing difficulties. She has not experienced any abnormal Pap smears in the past and has not had issues with low vitamin D levels.     Past Medical History:  Diagnosis Date   Hypercholesteremia    Hypertension     Medications: Outpatient Medications Prior to Visit  Medication Sig   Ascorbic Acid (VITAMIN C PO) Take by mouth.   atorvastatin  (LIPITOR) 40 MG tablet Take 1 tablet (40 mg total) by mouth daily.   lisinopril  (ZESTRIL ) 20 MG tablet Take 1 tablet (20 mg total) by mouth daily.   [DISCONTINUED] amLODipine  (NORVASC ) 10 MG tablet Take 1 tablet (10 mg total) by mouth daily.   No facility-administered medications prior to visit.    Review of Systems  Last metabolic panel Lab Results  Component Value Date   GLUCOSE 139 (H) 02/20/2023   NA 140 02/20/2023   K 4.0 02/20/2023   CL 103 02/20/2023   CO2 24 02/20/2023   BUN 14  02/20/2023   CREATININE 1.01 (H) 02/20/2023   EGFR 63 02/20/2023   CALCIUM  9.5 02/20/2023   PROT 7.4 02/20/2023   ALBUMIN 4.4 02/20/2023   LABGLOB 3.0 02/20/2023   AGRATIO 1.6 03/07/2022   BILITOT 0.9 02/20/2023   ALKPHOS 90 02/20/2023   AST 18 02/20/2023   ALT 13 02/20/2023   Last lipids Lab Results  Component Value Date   CHOL 181 02/20/2023   HDL 73 02/20/2023   LDLCALC 97 02/20/2023   TRIG 55 02/20/2023   CHOLHDL 2.5 02/20/2023   The ASCVD Risk score (Arnett DK, et al., 2019) failed to calculate for the following reasons:   The valid systolic blood pressure range is 90 to 200 mmHg  Last hemoglobin A1c Lab Results  Component Value Date   HGBA1C 6.1 (H) 02/20/2023   Last thyroid functions No results found for: TSH, T3TOTAL, T4TOTAL, THYROIDAB Last vitamin D No results found for: 25OHVITD2, 25OHVITD3, VD25OH      Objective    BP (!) 86/60 (Cuff Size: Normal)   Pulse 76   Ht 5' 2 (1.575 m)   Wt 119 lb 3.2 oz (54.1 kg)   SpO2 100%   BMI 21.80 kg/m  BP Readings from Last 3 Encounters:  12/25/23 (!) 86/60  06/26/23 110/80  12/19/22 125/80   Wt Readings from Last 3 Encounters:  12/25/23 119 lb 3.2 oz (54.1 kg)  06/26/23 121 lb 1.6 oz (54.9 kg)  12/19/22 118 lb 1.6 oz (53.6  kg)        Physical Exam Vitals reviewed.  Constitutional:      General: She is not in acute distress.    Appearance: Normal appearance. She is not ill-appearing, toxic-appearing or diaphoretic.  Eyes:     Conjunctiva/sclera: Conjunctivae normal.  Cardiovascular:     Rate and Rhythm: Normal rate and regular rhythm.     Pulses: Normal pulses.     Heart sounds: Normal heart sounds. No murmur heard.    No friction rub. No gallop.  Pulmonary:     Effort: Pulmonary effort is normal. No respiratory distress.     Breath sounds: Normal breath sounds. No stridor. No wheezing, rhonchi or rales.  Abdominal:     General: Bowel sounds are normal. There is no distension.      Palpations: Abdomen is soft.     Tenderness: There is no abdominal tenderness.  Musculoskeletal:     Right lower leg: No edema.     Left lower leg: No edema.  Skin:    Findings: No erythema or rash.  Neurological:     Mental Status: She is alert and oriented to person, place, and time.  Psychiatric:        Mood and Affect: Mood and affect normal.        Speech: Speech normal.        Behavior: Behavior normal. Behavior is cooperative.       No results found for any visits on 12/25/23.  Assessment & Plan     Problem List Items Addressed This Visit       Cardiovascular and Mediastinum   Hypertension - Primary   Chronic  Hypertension is well-controlled with current medication regimen. Blood pressure reading was 86/60 mmHg, indicating potential overmedication. No symptoms of hypotension reported. Discussed the possibility of reducing medication to prevent overmedication and dehydration, especially in hot weather. - Reduce amlodipine  to 5 mg daily and monitor blood pressure at home - Instruct her to record blood pressure readings and review at next visit - Send prescription for 5 mg amlodipine  tablets to [facility] pharmacy - continue lisinopril  20mg  daily  - CMP ordered today       Relevant Medications   amLODipine  (NORVASC ) 5 MG tablet   Other Relevant Orders   CMP14+EGFR     Other   Hypercholesteremia   Chronic hyperlipidemia managed with atorvastatin . Previous cholesterol levels were within a good range. Plan to reassess current cholesterol levels with a lipid panel. - Continue atorvastatin  40 mg daily - Order lipid panel to assess current cholesterol levels      Relevant Medications   amLODipine  (NORVASC ) 5 MG tablet   Other Relevant Orders   Lipid panel   Other Visit Diagnoses       Encounter for screening mammogram for malignant neoplasm of breast       Relevant Orders   MM 3D SCREENING MAMMOGRAM BILATERAL BREAST     Colon cancer screening       Relevant Orders    Cologuard       Assessment & Plan   General Health Maintenance She is due for routine cancer screenings and a Pap smear. Insurance issues previously delayed these screenings. Discussed the importance of completing these screenings, including the potential for this Pap smear to be the last one if results are normal. - Ordered Cologuard for colon cancer screening - Ordered mammogram for breast cancer screening - Schedule Pap smear for a Friday in 1 month   Follow-up Follow-up  is necessary to assess the impact of medication adjustment and review test results. - Schedule follow-up appointment in one month to review blood pressure readings and test results - Perform blood work including lipid panel and metabolic panel     Return in about 1 month (around 01/24/2024) for pap smear .         Rockie Agent, MD  [facility] [phone] (phone) [phone] (fax)  [facility]

## 2023-12-26 LAB — CMP14+EGFR
ALT: 10 IU/L (ref 0–32)
AST: 18 IU/L (ref 0–40)
Albumin: 4.7 g/dL (ref 3.9–4.9)
Alkaline Phosphatase: 89 IU/L (ref 44–121)
BUN/Creatinine Ratio: 13 (ref 12–28)
BUN: 15 mg/dL (ref 8–27)
Bilirubin Total: 1 mg/dL (ref 0.0–1.2)
CO2: 22 mmol/L (ref 20–29)
Calcium: 10 mg/dL (ref 8.7–10.3)
Chloride: 103 mmol/L (ref 96–106)
Creatinine, Ser: 1.16 mg/dL — ABNORMAL HIGH (ref 0.57–1.00)
Globulin, Total: 2.9 g/dL (ref 1.5–4.5)
Glucose: 107 mg/dL — ABNORMAL HIGH (ref 70–99)
Potassium: 4 mmol/L (ref 3.5–5.2)
Sodium: 141 mmol/L (ref 134–144)
Total Protein: 7.6 g/dL (ref 6.0–8.5)
eGFR: 53 mL/min/{1.73_m2} — ABNORMAL LOW (ref >59)

## 2023-12-26 LAB — LIPID PANEL
Chol/HDL Ratio: 2.6 ratio (ref 0.0–4.4)
Cholesterol, Total: 190 mg/dL (ref 100–199)
HDL: 74 mg/dL (ref >39)
LDL Chol Calc (NIH): 105 mg/dL — ABNORMAL HIGH (ref 0–99)
Triglycerides: 58 mg/dL (ref 0–149)
VLDL Cholesterol Cal: 11 mg/dL (ref 5–40)

## 2023-12-28 NOTE — Addendum Note (Signed)
 Addended by: SIMMONS-ROBINSON, Diontay Rosencrans L on: 12/28/2023 07:47 AM   Modules accepted: Orders

## 2024-01-18 DIAGNOSIS — Z1211 Encounter for screening for malignant neoplasm of colon: Secondary | ICD-10-CM | POA: Diagnosis not present

## 2024-01-28 LAB — COLOGUARD: COLOGUARD: NEGATIVE

## 2024-01-29 ENCOUNTER — Ambulatory Visit: Payer: Self-pay | Admitting: Family Medicine

## 2024-02-19 ENCOUNTER — Ambulatory Visit (INDEPENDENT_AMBULATORY_CARE_PROVIDER_SITE_OTHER): Admitting: Family Medicine

## 2024-02-19 ENCOUNTER — Encounter: Payer: Self-pay | Admitting: Family Medicine

## 2024-02-19 ENCOUNTER — Other Ambulatory Visit (HOSPITAL_COMMUNITY)
Admission: RE | Admit: 2024-02-19 | Discharge: 2024-02-19 | Disposition: A | Discharge status: Home / Self Care | Source: Ambulatory Visit | Attending: Family Medicine | Admitting: Family Medicine

## 2024-02-19 VITALS — BP 130/89 | HR 77 | Wt 120.0 lb

## 2024-02-19 DIAGNOSIS — Z01419 Encounter for gynecological examination (general) (routine) without abnormal findings: Secondary | ICD-10-CM

## 2024-02-19 DIAGNOSIS — Z124 Encounter for screening for malignant neoplasm of cervix: Secondary | ICD-10-CM | POA: Diagnosis present

## 2024-02-19 DIAGNOSIS — Z01411 Encounter for gynecological examination (general) (routine) with abnormal findings: Secondary | ICD-10-CM

## 2024-02-19 NOTE — Progress Notes (Signed)
      Established patient visit   Patient: Jodi Martin   DOB: 1961-08-10   63 y.o. Female  MRN: [id number] Visit Date: 02/19/2024  Today's healthcare provider: Mimi Alt, MD   Chief Complaint  Patient presents with   Gynecologic Exam    pap   Subjective     HPI     Gynecologic Exam    Additional comments: pap      Last edited by Bart Lieu, CMA on 02/19/2024  1:43 PM.       Discussed the use of AI scribe software for clinical note transcription with the patient, who gave verbal consent to proceed.  History of Present Illness She is a 63 year old female who presents for cervical cancer screening and a Pap smear.  She is undergoing routine cervical cancer screening, including a Pap smear, and reports no symptoms such as itching, discharge, or pain during the procedure.  She has no history of sexually transmitted infections or symptoms suggestive of such conditions.  No itching, discharge, or pain.     Past Medical History:  Diagnosis Date   Hypercholesteremia    Hypertension     Medications: Outpatient Medications Prior to Visit  Medication Sig   amLODipine  (NORVASC ) 5 MG tablet Take 1 tablet (5 mg total) by mouth daily.   Ascorbic Acid (VITAMIN C PO) Take by mouth.   atorvastatin  (LIPITOR) 40 MG tablet Take 1 tablet (40 mg total) by mouth daily.   lisinopril  (ZESTRIL ) 20 MG tablet Take 1 tablet (20 mg total) by mouth daily.   No facility-administered medications prior to visit.    Review of Systems      Objective    BP 130/89   Pulse 77   Wt 120 lb (54.4 kg)   SpO2 100%   BMI 21.95 kg/m      Physical Exam  Physical Exam GENERAL: No acute distress. GENITOURINARY: External and internal vagina without lesions. No cervical or vaginal bleeding. No vaginal discharge.    No results found for any visits on 02/19/24.  Assessment & Plan     Problem List Items Addressed This Visit   None Visit Diagnoses        Screening for cervical cancer    -  Primary   Relevant Orders   Cytology - PAP       Assessment & Plan Cervical Cancer Screening Undergoing routine cervical cancer screening with a Pap smear. Asymptomatic with no itching, discharge, lesions, or bleeding observed during the gynecological exam. Pap smear is recommended every five years if results are normal. - Submit Pap smear for analysis - Inform her that results are expected early next week - Advise that if results are normal, the next Pap smear will be in five years     No follow-ups on file.         Mimi Alt, MD  [facility] [phone] (phone) [phone] (fax)  [facility]

## 2024-02-21 ENCOUNTER — Encounter: Payer: Self-pay | Admitting: Family Medicine

## 2024-02-24 LAB — CYTOLOGY - PAP
Comment: NEGATIVE
Diagnosis: NEGATIVE
High risk HPV: NEGATIVE

## 2024-03-04 ENCOUNTER — Ambulatory Visit: Payer: Self-pay | Admitting: Family Medicine

## 2024-06-19 ENCOUNTER — Other Ambulatory Visit: Payer: Self-pay | Admitting: Family Medicine

## 2024-06-19 DIAGNOSIS — I10 Essential (primary) hypertension: Secondary | ICD-10-CM

## 2024-07-15 ENCOUNTER — Other Ambulatory Visit: Payer: Self-pay | Admitting: Family Medicine

## 2024-07-15 DIAGNOSIS — E7800 Pure hypercholesterolemia, unspecified: Secondary | ICD-10-CM

## 2024-08-27 ENCOUNTER — Other Ambulatory Visit: Payer: Self-pay | Admitting: Family Medicine

## 2024-08-27 DIAGNOSIS — I10 Essential (primary) hypertension: Secondary | ICD-10-CM

## 2024-09-17 ENCOUNTER — Other Ambulatory Visit: Payer: Self-pay | Admitting: Family Medicine

## 2024-09-17 DIAGNOSIS — I10 Essential (primary) hypertension: Secondary | ICD-10-CM

## 2024-09-20 ENCOUNTER — Other Ambulatory Visit: Payer: Self-pay | Admitting: Family Medicine

## 2024-09-20 DIAGNOSIS — I10 Essential (primary) hypertension: Secondary | ICD-10-CM
